# Patient Record
Sex: Male | Born: 1970 | Race: White | Hispanic: No | Marital: Married
Health system: Southern US, Community
[De-identification: ages and names within clinical notes are randomized; demographics above are authoritative.]

---

## 2018-10-03 ENCOUNTER — Inpatient Hospital Stay (HOSPITAL_COMMUNITY)
Admission: EM | Admit: 2018-10-03 | Discharge: 2018-10-10 | DRG: 082 | Disposition: E | Payer: Medicaid Other | Attending: General Surgery | Admitting: General Surgery

## 2018-10-03 ENCOUNTER — Other Ambulatory Visit: Payer: Self-pay

## 2018-10-03 ENCOUNTER — Encounter (HOSPITAL_COMMUNITY): Payer: Self-pay | Admitting: *Deleted

## 2018-10-03 ENCOUNTER — Inpatient Hospital Stay (HOSPITAL_COMMUNITY): Payer: Medicaid Other

## 2018-10-03 ENCOUNTER — Emergency Department (HOSPITAL_COMMUNITY): Payer: Medicaid Other

## 2018-10-03 DIAGNOSIS — S02831A Fracture of medial orbital wall, right side, initial encounter for closed fracture: Secondary | ICD-10-CM | POA: Diagnosis present

## 2018-10-03 DIAGNOSIS — S020XXA Fracture of vault of skull, initial encounter for closed fracture: Secondary | ICD-10-CM | POA: Diagnosis present

## 2018-10-03 DIAGNOSIS — S061X9A Traumatic cerebral edema with loss of consciousness of unspecified duration, initial encounter: Secondary | ICD-10-CM | POA: Diagnosis present

## 2018-10-03 DIAGNOSIS — S02101A Fracture of base of skull, right side, initial encounter for closed fracture: Secondary | ICD-10-CM | POA: Diagnosis present

## 2018-10-03 DIAGNOSIS — R402312 Coma scale, best motor response, none, at arrival to emergency department: Secondary | ICD-10-CM | POA: Diagnosis present

## 2018-10-03 DIAGNOSIS — S0231XA Fracture of orbital floor, right side, initial encounter for closed fracture: Secondary | ICD-10-CM | POA: Diagnosis present

## 2018-10-03 DIAGNOSIS — S0101XA Laceration without foreign body of scalp, initial encounter: Secondary | ICD-10-CM | POA: Diagnosis present

## 2018-10-03 DIAGNOSIS — S066X9A Traumatic subarachnoid hemorrhage with loss of consciousness of unspecified duration, initial encounter: Principal | ICD-10-CM | POA: Diagnosis present

## 2018-10-03 DIAGNOSIS — W3189XA Contact with other specified machinery, initial encounter: Secondary | ICD-10-CM

## 2018-10-03 DIAGNOSIS — T1490XA Injury, unspecified, initial encounter: Secondary | ICD-10-CM

## 2018-10-03 DIAGNOSIS — I959 Hypotension, unspecified: Secondary | ICD-10-CM | POA: Diagnosis not present

## 2018-10-03 DIAGNOSIS — E876 Hypokalemia: Secondary | ICD-10-CM | POA: Diagnosis present

## 2018-10-03 DIAGNOSIS — S0990XA Unspecified injury of head, initial encounter: Secondary | ICD-10-CM | POA: Diagnosis present

## 2018-10-03 DIAGNOSIS — Z20828 Contact with and (suspected) exposure to other viral communicable diseases: Secondary | ICD-10-CM | POA: Diagnosis present

## 2018-10-03 DIAGNOSIS — J9602 Acute respiratory failure with hypercapnia: Secondary | ICD-10-CM | POA: Diagnosis present

## 2018-10-03 DIAGNOSIS — Y9389 Activity, other specified: Secondary | ICD-10-CM

## 2018-10-03 DIAGNOSIS — S02841A Fracture of lateral orbital wall, right side, initial encounter for closed fracture: Secondary | ICD-10-CM | POA: Diagnosis present

## 2018-10-03 DIAGNOSIS — R402212 Coma scale, best verbal response, none, at arrival to emergency department: Secondary | ICD-10-CM | POA: Diagnosis present

## 2018-10-03 DIAGNOSIS — Z01818 Encounter for other preprocedural examination: Secondary | ICD-10-CM

## 2018-10-03 DIAGNOSIS — R4182 Altered mental status, unspecified: Secondary | ICD-10-CM | POA: Diagnosis present

## 2018-10-03 DIAGNOSIS — R0902 Hypoxemia: Secondary | ICD-10-CM

## 2018-10-03 DIAGNOSIS — R402112 Coma scale, eyes open, never, at arrival to emergency department: Secondary | ICD-10-CM | POA: Diagnosis present

## 2018-10-03 DIAGNOSIS — G9382 Brain death: Secondary | ICD-10-CM | POA: Diagnosis present

## 2018-10-03 DIAGNOSIS — Y9259 Other trade areas as the place of occurrence of the external cause: Secondary | ICD-10-CM

## 2018-10-03 DIAGNOSIS — I609 Nontraumatic subarachnoid hemorrhage, unspecified: Secondary | ICD-10-CM

## 2018-10-03 LAB — POCT I-STAT 7, (LYTES, BLD GAS, ICA,H+H)
Acid-base deficit: 2 mmol/L (ref 0.0–2.0)
Acid-base deficit: 2 mmol/L (ref 0.0–2.0)
Bicarbonate: 26 mmol/L (ref 20.0–28.0)
Bicarbonate: 21.1 mmol/L (ref 20.0–28.0)
Calcium, Ion: 1.1 mmol/L — ABNORMAL LOW (ref 1.15–1.40)
Calcium, Ion: 1.12 mmol/L — ABNORMAL LOW (ref 1.15–1.40)
HCT: 39 % (ref 39.0–52.0)
HCT: 38 % — ABNORMAL LOW (ref 39.0–52.0)
Hemoglobin: 13.3 g/dL (ref 13.0–17.0)
Hemoglobin: 12.9 g/dL — ABNORMAL LOW (ref 13.0–17.0)
O2 Saturation: 100 %
O2 Saturation: 99 %
Patient temperature: 95.5
Patient temperature: 38.7
Potassium: 3.8 mmol/L (ref 3.5–5.1)
Potassium: 4.2 mmol/L (ref 3.5–5.1)
Sodium: 138 mmol/L (ref 135–145)
Sodium: 140 mmol/L (ref 135–145)
TCO2: 28 mmol/L (ref 22–32)
TCO2: 22 mmol/L (ref 22–32)
pCO2 arterial: 53.9 mmHg — ABNORMAL HIGH (ref 32.0–48.0)
pCO2 arterial: 32.8 mmHg (ref 32.0–48.0)
pH, Arterial: 7.283 — ABNORMAL LOW (ref 7.350–7.450)
pH, Arterial: 7.423 (ref 7.350–7.450)
pO2, Arterial: 542 mmHg — ABNORMAL HIGH (ref 83.0–108.0)
pO2, Arterial: 141 mmHg — ABNORMAL HIGH (ref 83.0–108.0)

## 2018-10-03 LAB — URINALYSIS, ROUTINE W REFLEX MICROSCOPIC
Bilirubin Urine: NEGATIVE
Glucose, UA: >=500 mg/dL — AB
Ketones, ur: NEGATIVE mg/dL
Leukocytes,Ua: NEGATIVE
Nitrite: NEGATIVE
Protein, ur: 100 mg/dL — AB
Specific Gravity, Urine: 1.017 (ref 1.005–1.030)
pH: 5 (ref 5.0–8.0)

## 2018-10-03 LAB — COMPREHENSIVE METABOLIC PANEL
ALT: 37 U/L (ref 0–44)
AST: 46 U/L — ABNORMAL HIGH (ref 15–41)
Albumin: 3.4 g/dL — ABNORMAL LOW (ref 3.5–5.0)
Alkaline Phosphatase: 80 U/L (ref 38–126)
Anion gap: 15 (ref 5–15)
BUN: 15 mg/dL (ref 6–20)
CO2: 23 mmol/L (ref 22–32)
Calcium: 8 mg/dL — ABNORMAL LOW (ref 8.9–10.3)
Chloride: 98 mmol/L (ref 98–111)
Creatinine, Ser: 2.17 mg/dL — ABNORMAL HIGH (ref 0.61–1.24)
GFR calc Af Amer: 21 mL/min — ABNORMAL LOW (ref >60)
GFR calc non Af Amer: 18 mL/min — ABNORMAL LOW (ref >60)
Glucose, Bld: 329 mg/dL — ABNORMAL HIGH (ref 70–99)
Potassium: 3.9 mmol/L (ref 3.5–5.1)
Sodium: 136 mmol/L (ref 135–145)
Total Bilirubin: 0.2 mg/dL — ABNORMAL LOW (ref 0.3–1.2)
Total Protein: 5.6 g/dL — ABNORMAL LOW (ref 6.5–8.1)

## 2018-10-03 LAB — BASIC METABOLIC PANEL
Anion gap: 12 (ref 5–15)
BUN: 16 mg/dL (ref 6–20)
CO2: 22 mmol/L (ref 22–32)
Calcium: 7.9 mg/dL — ABNORMAL LOW (ref 8.9–10.3)
Chloride: 104 mmol/L (ref 98–111)
Creatinine, Ser: 1.81 mg/dL — ABNORMAL HIGH (ref 0.61–1.24)
GFR calc Af Amer: 50 mL/min — ABNORMAL LOW (ref >60)
GFR calc non Af Amer: 44 mL/min — ABNORMAL LOW (ref >60)
Glucose, Bld: 207 mg/dL — ABNORMAL HIGH (ref 70–99)
Potassium: 3.3 mmol/L — ABNORMAL LOW (ref 3.5–5.1)
Sodium: 138 mmol/L (ref 135–145)

## 2018-10-03 LAB — CBC
HCT: 39.3 % (ref 39.0–52.0)
HCT: 39.4 % (ref 39.0–52.0)
Hemoglobin: 12.7 g/dL — ABNORMAL LOW (ref 13.0–17.0)
Hemoglobin: 12.5 g/dL — ABNORMAL LOW (ref 13.0–17.0)
MCH: 33.7 pg (ref 26.0–34.0)
MCH: 33.5 pg (ref 26.0–34.0)
MCHC: 32.3 g/dL (ref 30.0–36.0)
MCHC: 31.7 g/dL (ref 30.0–36.0)
MCV: 104.2 fL — ABNORMAL HIGH (ref 80.0–100.0)
MCV: 105.6 fL — ABNORMAL HIGH (ref 80.0–100.0)
Platelets: 304 10*3/uL (ref 150–400)
Platelets: 314 10*3/uL (ref 150–400)
RBC: 3.77 MIL/uL — ABNORMAL LOW (ref 4.22–5.81)
RBC: 3.73 MIL/uL — ABNORMAL LOW (ref 4.22–5.81)
RDW: 12.7 % (ref 11.5–15.5)
RDW: 12.8 % (ref 11.5–15.5)
WBC: 14.7 10*3/uL — ABNORMAL HIGH (ref 4.0–10.5)
WBC: 14.4 10*3/uL — ABNORMAL HIGH (ref 4.0–10.5)
nRBC: 0 % (ref 0.0–0.2)
nRBC: 0.2 % (ref 0.0–0.2)

## 2018-10-03 LAB — CDS SEROLOGY

## 2018-10-03 LAB — LACTIC ACID, PLASMA: Lactic Acid, Venous: 6.4 mmol/L (ref 0.5–1.9)

## 2018-10-03 LAB — GLUCOSE, CAPILLARY
Glucose-Capillary: 133 mg/dL — ABNORMAL HIGH (ref 70–99)
Glucose-Capillary: 139 mg/dL — ABNORMAL HIGH (ref 70–99)
Glucose-Capillary: 106 mg/dL — ABNORMAL HIGH (ref 70–99)
Glucose-Capillary: 98 mg/dL (ref 70–99)
Glucose-Capillary: 103 mg/dL — ABNORMAL HIGH (ref 70–99)

## 2018-10-03 LAB — SARS CORONAVIRUS 2 (TAT 6-24 HRS): SARS Coronavirus 2: NEGATIVE

## 2018-10-03 LAB — SAMPLE TO BLOOD BANK

## 2018-10-03 LAB — PROTIME-INR
INR: 1.1 (ref 0.8–1.2)
Prothrombin Time: 14 seconds (ref 11.4–15.2)

## 2018-10-03 LAB — TRIGLYCERIDES: Triglycerides: 345 mg/dL — ABNORMAL HIGH (ref <150)

## 2018-10-03 LAB — ETHANOL: Alcohol, Ethyl (B): <10 mg/dL (ref <10)

## 2018-10-03 LAB — HEMOGLOBIN A1C
Hgb A1c MFr Bld: 5.2 % (ref 4.8–5.6)
Mean Plasma Glucose: 102.54 mg/dL

## 2018-10-03 LAB — CBG MONITORING, ED: Glucose-Capillary: 196 mg/dL — ABNORMAL HIGH (ref 70–99)

## 2018-10-03 MED ORDER — ACETAMINOPHEN 325 MG PO TABS
650.0000 mg | ORAL_TABLET | ORAL | Status: DC | PRN
  Administered 2018-10-03: 10:00:00 650 mg via ORAL
  Filled 2018-10-03: qty 2

## 2018-10-03 MED ORDER — MORPHINE SULFATE (PF) 2 MG/ML IV SOLN
2.0000 mg | INTRAVENOUS | Status: DC | PRN
  Administered 2018-10-07: 06:00:00 4 mg via INTRAVENOUS
  Filled 2018-10-03: qty 2

## 2018-10-03 MED ORDER — FENTANYL 2500MCG IN NS 250ML (10MCG/ML) PREMIX INFUSION
50.0000 ug/h | INTRAVENOUS | Status: DC
  Administered 2018-10-03: 02:00:00 50 ug/h via INTRAVENOUS
  Administered 2018-10-03 – 2018-10-06 (×6): 200 ug/h via INTRAVENOUS
  Filled 2018-10-03 (×6): qty 250

## 2018-10-03 MED ORDER — PROPOFOL 1000 MG/100ML IV EMUL
INTRAVENOUS | Status: AC
  Administered 2018-10-03: 06:00:00 30 ug/kg/min via INTRAVENOUS
  Filled 2018-10-03: qty 100

## 2018-10-03 MED ORDER — CHLORHEXIDINE GLUCONATE 0.12% ORAL RINSE (MEDLINE KIT)
15.0000 mL | Freq: Two times a day (BID) | OROMUCOSAL | Status: DC
  Administered 2018-10-03 – 2018-10-06 (×8): 15 mL via OROMUCOSAL

## 2018-10-03 MED ORDER — DOCUSATE SODIUM 100 MG PO CAPS: 100.0000 mg | ORAL_CAPSULE | Freq: Two times a day (BID) | ORAL | Status: DC

## 2018-10-03 MED ORDER — PANTOPRAZOLE SODIUM 40 MG PO PACK
40.0000 mg | PACK | Freq: Every day | ORAL | Status: DC
  Administered 2018-10-03: 40 mg
  Filled 2018-10-03: qty 20

## 2018-10-03 MED ORDER — INSULIN ASPART 100 UNIT/ML ~~LOC~~ SOLN
0.0000 [IU] | SUBCUTANEOUS | Status: DC
  Administered 2018-10-03: 2 [IU] via SUBCUTANEOUS
  Administered 2018-10-03: 04:00:00 3 [IU] via SUBCUTANEOUS
  Administered 2018-10-03: 10:00:00 2 [IU] via SUBCUTANEOUS

## 2018-10-03 MED ORDER — HYDRALAZINE HCL 20 MG/ML IJ SOLN: 10.0000 mg | INTRAMUSCULAR | Status: DC | PRN

## 2018-10-03 MED ORDER — FENTANYL BOLUS VIA INFUSION
50.0000 ug | INTRAVENOUS | Status: DC | PRN
  Administered 2018-10-03 (×3): 50 ug via INTRAVENOUS
  Filled 2018-10-03: qty 50

## 2018-10-03 MED ORDER — FENTANYL CITRATE (PF) 100 MCG/2ML IJ SOLN
50.0000 ug | Freq: Once | INTRAMUSCULAR | Status: AC
  Administered 2018-10-03: 02:00:00 50 ug via INTRAVENOUS

## 2018-10-03 MED ORDER — POTASSIUM CHLORIDE 10 MEQ/100ML IV SOLN
10.0000 meq | INTRAVENOUS | Status: AC
  Administered 2018-10-03 (×2): 10 meq via INTRAVENOUS
  Filled 2018-10-03 (×2): qty 100

## 2018-10-03 MED ORDER — SODIUM CHLORIDE 0.9 % IV SOLN
INTRAVENOUS | Status: DC
  Administered 2018-10-03 – 2018-10-06 (×8): via INTRAVENOUS

## 2018-10-03 MED ORDER — PROPOFOL 1000 MG/100ML IV EMUL
0.0000 ug/kg/min | INTRAVENOUS | Status: DC
  Administered 2018-10-03: 02:00:00 10 ug/kg/min via INTRAVENOUS
  Administered 2018-10-03: 20 ug/kg/min via INTRAVENOUS
  Administered 2018-10-03 (×2): 30 ug/kg/min via INTRAVENOUS
  Administered 2018-10-04 – 2018-10-06 (×5): 20 ug/kg/min via INTRAVENOUS
  Filled 2018-10-03 (×9): qty 100

## 2018-10-03 MED ORDER — FENTANYL 2500MCG IN NS 250ML (10MCG/ML) PREMIX INFUSION
0.0000 ug/h | INTRAVENOUS | Status: DC
  Filled 2018-10-03: qty 250

## 2018-10-03 MED ORDER — ETOMIDATE 2 MG/ML IV SOLN
INTRAVENOUS | Status: AC | PRN
  Administered 2018-10-03: 20 mg via INTRAVENOUS

## 2018-10-03 MED ORDER — PROPOFOL 1000 MG/100ML IV EMUL: 5.0000 ug/kg/min | INTRAVENOUS | Status: DC

## 2018-10-03 MED ORDER — SUCCINYLCHOLINE CHLORIDE 20 MG/ML IJ SOLN
INTRAMUSCULAR | Status: AC | PRN
  Administered 2018-10-03: 200 mg via INTRAVENOUS

## 2018-10-03 MED ORDER — ORAL CARE MOUTH RINSE
15.0000 mL | OROMUCOSAL | Status: DC
  Administered 2018-10-03 – 2018-10-07 (×38): 15 mL via OROMUCOSAL

## 2018-10-03 MED ORDER — CHLORHEXIDINE GLUCONATE CLOTH 2 % EX PADS
6.0000 | MEDICATED_PAD | Freq: Every day | CUTANEOUS | Status: DC
  Administered 2018-10-04: 04:00:00 6 via TOPICAL

## 2018-10-03 MED ORDER — METOPROLOL TARTRATE 5 MG/5ML IV SOLN
5.0000 mg | Freq: Four times a day (QID) | INTRAVENOUS | Status: DC | PRN
  Administered 2018-10-05: 15:00:00 5 mg via INTRAVENOUS
  Filled 2018-10-03 (×2): qty 5

## 2018-10-03 NOTE — Progress Notes (Addendum)
Patient ID: Deakon Frix, male   DOB: Feb 03, 1970, 48 y.o.   MRN: [id number]  We were called in regards to this 48 year old male who was working underneath a car when the driveshaft broke and the car landed on his head. He was found unresponsive, cpr was performed. Ct head shows diffuse cerebral edema with tonsillar herniation consistent with a severe traumatic brain injury and DAI. Multiple skull fractures with brain matter to be found in the right frontal sinus. Trauma is admitting to the icu. gcs is currently a 3 with myoclonic jerking. There is no acute surgical intervention needed at this time as this is not likely a survivable injury. CT and plan was discussed with Dr. Darolyn Rua will plan to formally consult this morning.   Agree with above....severe global cerebral edema, loss of gray/white differentiation, poor prognosis.

## 2018-10-03 NOTE — Consult Note (Signed)
Reason for Consult: Closed head injury Referring Physician: Trauma  Ryan Kyle Sr. is an 48 y.o. male.  HPI: 48 year old gentleman who was underneath a vehicle and drivetrain fell on his head unknown period of time he was under the vehicle patient presented unresponsive with diffuse subarachnoid hemorrhage and cerebral edema and we were consulted.  History reviewed. No pertinent past medical history.    No family history on file.  Social History:  has no history on file for tobacco, alcohol, and drug.  Allergies: Not on File  Medications: I have reviewed the patient's current medications.  Results for orders placed or performed during the hospital encounter of 2018-10-31 (from the past 48 hour(s))  CDS serology     Status: None   Collection Time: 10/31/18 12:38 AM  Result Value Ref Range   CDS serology specimen      SPECIMEN WILL BE HELD FOR 14 DAYS IF TESTING IS REQUIRED    Comment: SPECIMEN WILL BE HELD FOR 14 DAYS IF TESTING IS REQUIRED SPECIMEN WILL BE HELD FOR 14 DAYS IF TESTING IS REQUIRED Performed at [facility], [address], [place], [place] [address]   Comprehensive metabolic panel     Status: Abnormal   Collection Time: 2018/10/31 12:38 AM  Result Value Ref Range   Sodium 136 135 - 145 mmol/L   Potassium 3.9 3.5 - 5.1 mmol/L   Chloride 98 98 - 111 mmol/L   CO2 23 22 - 32 mmol/L   Glucose, Bld 329 (H) 70 - 99 mg/dL   BUN 15 6 - 20 mg/dL    Comment: QA FLAGS AND/OR RANGES MODIFIED BY DEMOGRAPHIC UPDATE ON 09/24 AT 0218   Creatinine, Ser 2.17 (H) 0.61 - 1.24 mg/dL   Calcium 8.0 (L) 8.9 - 10.3 mg/dL   Total Protein 5.6 (L) 6.5 - 8.1 g/dL   Albumin 3.4 (L) 3.5 - 5.0 g/dL   AST 46 (H) 15 - 41 U/L   ALT 37 0 - 44 U/L   Alkaline Phosphatase 80 38 - 126 U/L   Total Bilirubin 0.2 (L) 0.3 - 1.2 mg/dL   GFR calc non Af Amer 18 (L) >60 mL/min   GFR calc Af Amer 21 (L) >60 mL/min   Anion gap 15 5 - 15    Comment: Performed at [facility], [address], [place], [place] [address]  CBC     Status: Abnormal   Collection Time: Oct 31, 2018 12:38 AM  Result Value Ref Range   WBC 14.7 (H) 4.0 - 10.5 K/uL   RBC 3.77 (L) 4.22 - 5.81 MIL/uL   Hemoglobin 12.7 (L) 13.0 - 17.0 g/dL   HCT 09.8 11.9 - 14.7 %   MCV 104.2 (H) 80.0 - 100.0 fL   MCH 33.7 26.0 - 34.0 pg   MCHC 32.3 30.0 - 36.0 g/dL   RDW 82.9 56.2 - 13.0 %   Platelets 304 150 - 400 K/uL   nRBC 0.0 0.0 - 0.2 %    Comment: Performed at [facility], [address] [place], [place] [address]  Ethanol     Status: None   Collection Time: Oct 31, 2018 12:38 AM  Result Value Ref Range   Alcohol, Ethyl (B) <10 <10 mg/dL    Comment: (NOTE) Lowest detectable limit for serum alcohol is 10 mg/dL. For medical purposes only. Performed at [facility], [address], [place], [place] [address]   Lactic acid, plasma     Status: Abnormal  Collection Time: 10/08/2018 12:38 AM  Result Value Ref Range   Lactic Acid, Venous 6.4 (HH) 0.5 - 1.9 mmol/L    Comment: CRITICAL RESULT CALLED TO, READ BACK BY AND VERIFIED WITH: OLDLAND B,RN 09/20/2018 0142 WAYK Performed at [facility], [address] [place], [place] [address]   Protime-INR     Status: None   Collection Time: 09/13/2018 12:38 AM  Result Value Ref Range   Prothrombin Time 14.0 11.4 - 15.2 seconds   INR 1.1 0.8 - 1.2    Comment: (NOTE) INR goal varies based on device and disease states. Performed at [facility], [address] [place], [place] [address]   Sample to Blood Bank     Status: None   Collection Time: 09/16/2018 12:38 AM  Result Value Ref Range   Blood Bank Specimen SAMPLE AVAILABLE FOR TESTING    Sample Expiration      10/04/2018,2359 Performed at [facility], [address] [place], [place] [address]   SARS CORONAVIRUS 2 (TAT 6-24 HRS) Nasopharyngeal Nasopharyngeal Swab     Status: None   Collection Time: 09/18/2018  1:15 AM   Specimen: Nasopharyngeal Swab  Result Value Ref Range   SARS  Coronavirus 2 NEGATIVE NEGATIVE    Comment: (NOTE) SARS-CoV-2 target nucleic acids are NOT DETECTED. The SARS-CoV-2 RNA is generally detectable in upper and lower respiratory specimens during the acute phase of infection. Negative results do not preclude SARS-CoV-2 infection, do not rule out co-infections with other pathogens, and should not be used as the sole basis for treatment or other patient management decisions. Negative results must be combined with clinical observations, patient history, and epidemiological information. The expected result is Negative. Fact Sheet for Patients: HairSlick.nohttps://www.fda.gov/media/138098/download Fact Sheet for Healthcare Providers: quierodirigir.comhttps://www.fda.gov/media/138095/download This test is not yet approved or cleared by the [place] FDA and  has been authorized for detection and/or diagnosis of SARS-CoV-2 by FDA under an Emergency Use Authorization (EUA). This EUA will remain  in effect (meaning this test can be used) for the duration of the COVID-19 declaration under Section 56 4(b)(1) of the Act, 21 U.S.C. section 360bbb-3(b)(1), unless the authorization is terminated or revoked sooner. Performed at [facility], [address] [place], [place] [address]   I-STAT 7, (LYTES, BLD GAS, ICA, H+H)     Status: Abnormal   Collection Time: 09/13/2018  2:15 AM  Result Value Ref Range   pH, Arterial 7.283 (L) 7.350 - 7.450   pCO2 arterial 53.9 (H) 32.0 - 48.0 mmHg   pO2, Arterial 542.0 (H) 83.0 - 108.0 mmHg   Bicarbonate 26.0 20.0 - 28.0 mmol/L   TCO2 28 22 - 32 mmol/L   O2 Saturation 100.0 %   Acid-base deficit 2.0 0.0 - 2.0 mmol/L   Sodium 138 135 - 145 mmol/L   Potassium 3.8 3.5 - 5.1 mmol/L   Calcium, Ion 1.10 (L) 1.15 - 1.40 mmol/L   HCT 39.0 39.0 - 52.0 %   Hemoglobin 13.3 13.0 - 17.0 g/dL   Patient temperature 29.595.5 F    Collection site RADIAL, ALLEN'S TEST ACCEPTABLE    Drawn by RT    Sample type ARTERIAL   Urinalysis, Routine w reflex  microscopic     Status: Abnormal   Collection Time: 09/29/2018  2:44 AM  Result Value Ref Range   Color, Urine YELLOW YELLOW   APPearance HAZY (A) CLEAR   Specific Gravity, Urine 1.017 1.005 - 1.030   pH 5.0  5.0 - 8.0   Glucose, UA >=500 (A) NEGATIVE mg/dL   Hgb urine dipstick SMALL (A) NEGATIVE   Bilirubin Urine NEGATIVE NEGATIVE   Ketones, ur NEGATIVE NEGATIVE mg/dL   Protein, ur 322 (A) NEGATIVE mg/dL   Nitrite NEGATIVE NEGATIVE   Leukocytes,Ua NEGATIVE NEGATIVE   RBC / HPF 6-10 0 - 5 RBC/hpf   WBC, UA 0-5 0 - 5 WBC/hpf   Bacteria, UA RARE (A) NONE SEEN   Squamous Epithelial / LPF 0-5 0 - 5   Hyaline Casts, UA PRESENT     Comment: Performed at [facility], [address] [place], [place] [address]  CBC     Status: Abnormal   Collection Time: 09/29/2018  3:38 AM  Result Value Ref Range   WBC 14.4 (H) 4.0 - 10.5 K/uL   RBC 3.73 (L) 4.22 - 5.81 MIL/uL   Hemoglobin 12.5 (L) 13.0 - 17.0 g/dL   HCT 70.6 23.7 - 62.8 %   MCV 105.6 (H) 80.0 - 100.0 fL   MCH 33.5 26.0 - 34.0 pg   MCHC 31.7 30.0 - 36.0 g/dL   RDW 31.5 17.6 - 16.0 %   Platelets 314 150 - 400 K/uL   nRBC 0.2 0.0 - 0.2 %    Comment: Performed at [facility], [address] [place], [place] [address]  Basic metabolic panel     Status: Abnormal   Collection Time: 09/29/2018  3:38 AM  Result Value Ref Range   Sodium 138 135 - 145 mmol/L   Potassium 3.3 (L) 3.5 - 5.1 mmol/L   Chloride 104 98 - 111 mmol/L   CO2 22 22 - 32 mmol/L   Glucose, Bld 207 (H) 70 - 99 mg/dL   BUN 16 6 - 20 mg/dL   Creatinine, Ser 6.26 (H) 0.61 - 1.24 mg/dL   Calcium 7.9 (L) 8.9 - 10.3 mg/dL   GFR calc non Af Amer 44 (L) >60 mL/min   GFR calc Af Amer 50 (L) >60 mL/min   Anion gap 12 5 - 15    Comment: Performed at [facility], [address] [place], [place] [address]  Hemoglobin A1c     Status: None   Collection Time: 09/30/2018  3:38 AM  Result Value Ref Range   Hgb A1c MFr Bld 5.2 4.8 - 5.6 %    Comment: (NOTE) Pre  diabetes:          5.7%-6.4% Diabetes:              >6.4% Glycemic control for   <7.0% adults with diabetes    Mean Plasma Glucose 102.54 mg/dL    Comment: Performed at [facility], [address], [place], [place] [address]  Triglycerides     Status: Abnormal   Collection Time: 10/09/2018  3:38 AM  Result Value Ref Range   Triglycerides 345 (H) <150 mg/dL    Comment: Performed at [facility], [address], [place], [place] [address]  CBG monitoring, ED     Status: Abnormal   Collection Time: 09/14/2018  3:55 AM  Result Value Ref Range   Glucose-Capillary 196 (H) 70 - 99 mg/dL    Ct Head Wo Contrast  Result Date: 09/28/2018 CLINICAL DATA:  Level 1 trauma. Found under a car with head trauma. EXAM: CT HEAD WITHOUT CONTRAST CT MAXILLOFACIAL WITHOUT CONTRAST CT CERVICAL SPINE WITHOUT CONTRAST TECHNIQUE: Multidetector CT imaging of the head, cervical spine, and maxillofacial structures were  performed using the standard protocol without intravenous contrast. Multiplanar CT image reconstructions of the cervical spine and maxillofacial structures were also generated. COMPARISON:  None. FINDINGS: CT HEAD FINDINGS Brain: Diffuse effacement of sulci consistent with cerebral edema. Basilar cistern effacement consistent with tonsillar herniation. Small size ventricles. Right frontal skull fracture with brain herniation into the frontal sinus. Small volume pneumocephalus. Associated subarachnoid hemorrhage in the right frontal lobe. Subarachnoid hemorrhage in the left parietal lobe. Increased density of the circle-of-Willis may be subarachnoid hemorrhage versus pseudo subarachnoid hemorrhage related to cerebral edema. Vascular: Symmetric hyperdensity in the region the MCAs. Skull: Displaced right frontal bone fracture with brain herniating into the frontal sinus. Fracture extends to the vertex into the left frontal and parietal bone. Nondisplaced fracture extends inferolaterally to involve the  anterior right temporal bone. Skull base fractures extend through the sella which is displaced. Displaced bilateral posterior occipital fractures. Other: Large right frontal scalp hematoma and air in the soft tissues. CT MAXILLOFACIAL FINDINGS Osseous: Displaced fracture through the right pterygoid plate. No nasal bone, zygomatic, or mandibular fracture. Temporomandibular joints are congruent. Poor dentition with multiple missing teeth. Leftward nasal septal deviation. Orbits: Comminuted right orbital fracture involving the superior, inferior, medial and lateral walls. Retrobulbar air and edema. There is mild right proptosis. No evidence of globe injury. No left orbital fracture. Sinuses: Displaced fracture through the right frontal sinus with brain herniation into the sinus cavity. Comminuted fractures through the right ethmoid air cells, right and left sphenoid sinus and sella turcica and right maxillary sinus. Heterogeneous opacification of the sinuses likely blood. Right maxillary hemosinus. Scattered air in the soft tissues likely secondary to sinus fracture. Soft tissues: Right periorbital hematoma. Multifocal soft tissue air. CT CERVICAL SPINE FINDINGS Alignment: Normal. Skull base and vertebrae: Bilateral skull base fractures just lateral to the occipital condyles, displaced on the right. Dens is intact. No fracture of the cervical spine. Soft tissues and spinal canal: Skull base hemorrhage. Disc levels: Disc space narrowing endplate spurring V6-H6 and C6-C7. Upper chest: Dependent right upper lobe opacity. No pneumothorax Other: None. Critical Value/emergent results were called by telephone at the time of interpretation on 10/15/2018 at 1:35 am to providerKinsinger , who verbally acknowledged these results. IMPRESSION: 1. Traumatic brain injury with multifocal subarachnoid hemorrhage, diffuse cerebral edema, and tonsillar herniation. Small right frontal pneumocephalus. 2. Displaced right frontal bone  fracture with brain matter extending into the right frontal sinus. Fracture extends to the right temporal, left frontal and parietal bones. 3. Fractures through the right frontal sinus, ethmoid air cells, maxillary sinus and both sphenoid sinuses. Skull base fractures through the sella turcica and extending just lateral to the occipital condyles. 4. Complex right orbital fracture involving medial, lateral, inferior and superior walls. Right orbital proptosis. Right pterygoid plate fracture. 5. No fracture or dislocation of the cervical spine. Electronically Signed   By: Keith Rake M.D.   On: 2018/10/15 01:50   Ct Cervical Spine Wo Contrast  Result Date: 10-15-2018 CLINICAL DATA:  Level 1 trauma. Found under a car with head trauma. EXAM: CT HEAD WITHOUT CONTRAST CT MAXILLOFACIAL WITHOUT CONTRAST CT CERVICAL SPINE WITHOUT CONTRAST TECHNIQUE: Multidetector CT imaging of the head, cervical spine, and maxillofacial structures were performed using the standard protocol without intravenous contrast. Multiplanar CT image reconstructions of the cervical spine and maxillofacial structures were also generated. COMPARISON:  None. FINDINGS: CT HEAD FINDINGS Brain: Diffuse effacement of sulci consistent with cerebral edema. Basilar cistern effacement consistent with tonsillar herniation. Small size ventricles. Right  frontal skull fracture with brain herniation into the frontal sinus. Small volume pneumocephalus. Associated subarachnoid hemorrhage in the right frontal lobe. Subarachnoid hemorrhage in the left parietal lobe. Increased density of the circle-of-Willis may be subarachnoid hemorrhage versus pseudo subarachnoid hemorrhage related to cerebral edema. Vascular: Symmetric hyperdensity in the region the MCAs. Skull: Displaced right frontal bone fracture with brain herniating into the frontal sinus. Fracture extends to the vertex into the left frontal and parietal bone. Nondisplaced fracture extends inferolaterally  to involve the anterior right temporal bone. Skull base fractures extend through the sella which is displaced. Displaced bilateral posterior occipital fractures. Other: Large right frontal scalp hematoma and air in the soft tissues. CT MAXILLOFACIAL FINDINGS Osseous: Displaced fracture through the right pterygoid plate. No nasal bone, zygomatic, or mandibular fracture. Temporomandibular joints are congruent. Poor dentition with multiple missing teeth. Leftward nasal septal deviation. Orbits: Comminuted right orbital fracture involving the superior, inferior, medial and lateral walls. Retrobulbar air and edema. There is mild right proptosis. No evidence of globe injury. No left orbital fracture. Sinuses: Displaced fracture through the right frontal sinus with brain herniation into the sinus cavity. Comminuted fractures through the right ethmoid air cells, right and left sphenoid sinus and sella turcica and right maxillary sinus. Heterogeneous opacification of the sinuses likely blood. Right maxillary hemosinus. Scattered air in the soft tissues likely secondary to sinus fracture. Soft tissues: Right periorbital hematoma. Multifocal soft tissue air. CT CERVICAL SPINE FINDINGS Alignment: Normal. Skull base and vertebrae: Bilateral skull base fractures just lateral to the occipital condyles, displaced on the right. Dens is intact. No fracture of the cervical spine. Soft tissues and spinal canal: Skull base hemorrhage. Disc levels: Disc space narrowing endplate spurring Z6-X0 and C6-C7. Upper chest: Dependent right upper lobe opacity. No pneumothorax Other: None. Critical Value/emergent results were called by telephone at the time of interpretation on 10/04/2018 at 1:35 am to providerKinsinger , who verbally acknowledged these results. IMPRESSION: 1. Traumatic brain injury with multifocal subarachnoid hemorrhage, diffuse cerebral edema, and tonsillar herniation. Small right frontal pneumocephalus. 2. Displaced right  frontal bone fracture with brain matter extending into the right frontal sinus. Fracture extends to the right temporal, left frontal and parietal bones. 3. Fractures through the right frontal sinus, ethmoid air cells, maxillary sinus and both sphenoid sinuses. Skull base fractures through the sella turcica and extending just lateral to the occipital condyles. 4. Complex right orbital fracture involving medial, lateral, inferior and superior walls. Right orbital proptosis. Right pterygoid plate fracture. 5. No fracture or dislocation of the cervical spine. Electronically Signed   By: Narda Rutherford M.D.   On: 10/06/2018 01:50   Dg Pelvis Portable  Result Date: 10/06/2018 CLINICAL DATA:  Level 1 trauma. EXAM: PORTABLE PELVIS 1-2 VIEWS COMPARISON:  None. FINDINGS: The cortical margins of the bony pelvis are intact. No fracture. Pubic symphysis and sacroiliac joints are congruent. Both femoral heads are well-seated in the respective acetabula. IMPRESSION: No pelvic fracture. Electronically Signed   By: Narda Rutherford M.D.   On: 09/18/2018 01:15   Dg Chest Port 1 View  Result Date: 09/17/2018 CLINICAL DATA:  Level 1 trauma. EXAM: PORTABLE CHEST 1 VIEW COMPARISON:  None. FINDINGS: Endotracheal tube tip at the thoracic inlet 5.6 cm from the carina. Enteric tube in place with tip below the diaphragm not included in the field of view. Lung volumes are low. Ill-defined right suprahilar opacity. Heart is normal in size. Normal mediastinal contours for technique. No pneumothorax or pleural effusion. Linear rounded densities  projecting over the right upper abdomen are likely external to the patient. No acute osseous abnormalities are seen. IMPRESSION: 1. Endotracheal tube tip at the thoracic inlet 5.6 cm from the carina. Enteric tube in place with tip below the diaphragm not included in the field of view. 2. Low lung volumes with vague right suprahilar opacity. This may be contusion, aspiration, or asymmetric  pulmonary edema. Electronically Signed   By: Narda Rutherford M.D.   On: 09/13/2018 01:17   Ct Maxillofacial Wo Contrast  Result Date: 09/17/2018 CLINICAL DATA:  Level 1 trauma. Found under a car with head trauma. EXAM: CT HEAD WITHOUT CONTRAST CT MAXILLOFACIAL WITHOUT CONTRAST CT CERVICAL SPINE WITHOUT CONTRAST TECHNIQUE: Multidetector CT imaging of the head, cervical spine, and maxillofacial structures were performed using the standard protocol without intravenous contrast. Multiplanar CT image reconstructions of the cervical spine and maxillofacial structures were also generated. COMPARISON:  None. FINDINGS: CT HEAD FINDINGS Brain: Diffuse effacement of sulci consistent with cerebral edema. Basilar cistern effacement consistent with tonsillar herniation. Small size ventricles. Right frontal skull fracture with brain herniation into the frontal sinus. Small volume pneumocephalus. Associated subarachnoid hemorrhage in the right frontal lobe. Subarachnoid hemorrhage in the left parietal lobe. Increased density of the circle-of-Willis may be subarachnoid hemorrhage versus pseudo subarachnoid hemorrhage related to cerebral edema. Vascular: Symmetric hyperdensity in the region the MCAs. Skull: Displaced right frontal bone fracture with brain herniating into the frontal sinus. Fracture extends to the vertex into the left frontal and parietal bone. Nondisplaced fracture extends inferolaterally to involve the anterior right temporal bone. Skull base fractures extend through the sella which is displaced. Displaced bilateral posterior occipital fractures. Other: Large right frontal scalp hematoma and air in the soft tissues. CT MAXILLOFACIAL FINDINGS Osseous: Displaced fracture through the right pterygoid plate. No nasal bone, zygomatic, or mandibular fracture. Temporomandibular joints are congruent. Poor dentition with multiple missing teeth. Leftward nasal septal deviation. Orbits: Comminuted right orbital fracture  involving the superior, inferior, medial and lateral walls. Retrobulbar air and edema. There is mild right proptosis. No evidence of globe injury. No left orbital fracture. Sinuses: Displaced fracture through the right frontal sinus with brain herniation into the sinus cavity. Comminuted fractures through the right ethmoid air cells, right and left sphenoid sinus and sella turcica and right maxillary sinus. Heterogeneous opacification of the sinuses likely blood. Right maxillary hemosinus. Scattered air in the soft tissues likely secondary to sinus fracture. Soft tissues: Right periorbital hematoma. Multifocal soft tissue air. CT CERVICAL SPINE FINDINGS Alignment: Normal. Skull base and vertebrae: Bilateral skull base fractures just lateral to the occipital condyles, displaced on the right. Dens is intact. No fracture of the cervical spine. Soft tissues and spinal canal: Skull base hemorrhage. Disc levels: Disc space narrowing endplate spurring Z6-X0 and C6-C7. Upper chest: Dependent right upper lobe opacity. No pneumothorax Other: None. Critical Value/emergent results were called by telephone at the time of interpretation on 09/10/2018 at 1:35 am to providerKinsinger , who verbally acknowledged these results. IMPRESSION: 1. Traumatic brain injury with multifocal subarachnoid hemorrhage, diffuse cerebral edema, and tonsillar herniation. Small right frontal pneumocephalus. 2. Displaced right frontal bone fracture with brain matter extending into the right frontal sinus. Fracture extends to the right temporal, left frontal and parietal bones. 3. Fractures through the right frontal sinus, ethmoid air cells, maxillary sinus and both sphenoid sinuses. Skull base fractures through the sella turcica and extending just lateral to the occipital condyles. 4. Complex right orbital fracture involving medial, lateral, inferior and superior walls.  Right orbital proptosis. Right pterygoid plate fracture. 5. No fracture or  dislocation of the cervical spine. Electronically Signed   By: Narda Rutherford M.D.   On: 10-23-2018 01:50    Review of Systems  Unable to perform ROS: Critical illness   Blood pressure (!) 160/103, pulse (!) 108, temperature 99.9 F (37.7 C), resp. rate (!) 32, height 6' (1.829 m), weight 113.4 kg, SpO2 100 %. Physical Exam  Neurological:  Pupils 3 not reactive significant right periorbital edema left no corneal no gag no response to noxious stimulation diffuse myoclonic jerks    Assessment/Plan: 48 year old with severe close head injury CT scan shows diffuse subarachnoid hemorrhage diffuse cerebral edema tonsillar herniation subarachnoid around the cervical medullary junction diffuse loss of gray-white especially in his temporal lobes and around his brainstem.  This imports a very poor prognosis and I do not feel like this is consistent with functional survival.  Patient has minimal to no exam.  Consider repeat CT later and possible blood flow study if once he is off propofol still displays no evidence of movement.  Ryan Mueller October 23, 2018, 8:30 AM

## 2018-10-03 NOTE — Care Management (Signed)
Pt inappropriate for SBIRT assessment at this time, as he is currently sedated and intubated.    Reinaldo Raddle, RN, BSN  Trauma/Neuro ICU Case Manager [phone]

## 2018-10-03 NOTE — Progress Notes (Signed)
RT transported pt to and from CT without event. 

## 2018-10-03 NOTE — Progress Notes (Signed)
Pt belongings include 7 one-dollar bills, 7 quarters, 3 dimes, 8 pennies, white chapstick, black knife, silver lighter,and two sets of keys. Also has shoes and other clothing.
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
## 2018-10-03 NOTE — Progress Notes (Signed)
Chaplain visited to support family and pray as the family was dealing with end of life. The chaplain will follow this situation closely.  Brion Aliment Chaplain Resident For questions concerning this note please contact me by pager [phone]
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
## 2018-10-03 NOTE — Progress Notes (Signed)
Patient ID: Ryan Hermann Sr., male   DOB: December 20, 1970, 48 y.o.   MRN: [id number] F/U CT H results reviewed with Dr. Saintclair Halsted.  Georganna Skeans, MD, MPH, [place] Trauma & General Surgery: [phone]
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
## 2018-10-03 NOTE — ED Notes (Signed)
Patient son calling Ab Leaming [phone] or we can call patient wife's number [phone] her name is Argenis Kumari   Asking for an update on patient, please call

## 2018-10-03 NOTE — ED Notes (Signed)
Myoclonic jerking noted

## 2018-10-03 NOTE — Progress Notes (Signed)
Patient ID: Ryan Thor Sr., male   DOB: Feb 24, 1970, 47 y.o.   MRN: [id number] Follow up - Trauma Critical Care  Patient Details:    Ryan Mahaffy Sr. is an 48 y.o. male.  Lines/tubes : Airway 7.5 mm (Active)  Secured at (cm) 25 cm 10-18-2018 0812  Measured From Lips 10-18-2018 0812  Secured Location Left 10-18-18 0812  Secured By [employer] 10-18-2018 0812  Tube Holder Repositioned Yes October 18, 2018 0812  Cuff Pressure (cm H2O) 30 cm H2O 10-18-2018 7829  Site Condition Drainage (Comment);Other (Comment) Oct 18, 2018 5621     NG/OG Tube Orogastric 16 Fr. Center mouth Xray (Active)  Site Assessment Clean;Dry;Intact October 18, 2018 0800  Ongoing Placement Verification No change in cm markings or external length of tube from initial placement;No change in respiratory status;No acute changes, not attributed to clinical condition 2018/10/18 0800  Status Clamped 10-18-2018 0800     Urethral Catheter (Active)  Indication for Insertion or Continuance of Catheter Unstable critically ill patients first 24-48 hours (See Criteria) 10/18/18 0800  Site Assessment Clean;Intact 2018/10/18 0800  Catheter Maintenance Bag below level of bladder;Catheter secured;Drainage bag/tubing not touching floor;Insertion date on drainage bag;No dependent loops;Seal intact 18-Oct-2018 0800  Collection Container Standard drainage bag 18-Oct-2018 0800  Securement Method Securing device (Describe) 10/18/2018 0800  Urinary Catheter Interventions (if applicable) Unclamped 30/86/57 0800    Microbiology/Sepsis markers: Results for orders placed or performed during the hospital encounter of 2018/10/18  SARS CORONAVIRUS 2 (TAT 6-24 HRS) Nasopharyngeal Nasopharyngeal Swab     Status: None   Collection Time: Oct 18, 2018  1:15 AM   Specimen: Nasopharyngeal Swab  Result Value Ref Range Status   SARS Coronavirus 2 NEGATIVE NEGATIVE Final    Comment: (NOTE) SARS-CoV-2 target nucleic acids are NOT DETECTED. The SARS-CoV-2 RNA is generally detectable  in upper and lower respiratory specimens during the acute phase of infection. Negative results do not preclude SARS-CoV-2 infection, do not rule out co-infections with other pathogens, and should not be used as the sole basis for treatment or other patient management decisions. Negative results must be combined with clinical observations, patient history, and epidemiological information. The expected result is Negative. Fact Sheet for Patients: SugarRoll.be Fact Sheet for Healthcare Providers: https://www.woods-mathews.com/ This test is not yet approved or cleared by the [place] FDA and  has been authorized for detection and/or diagnosis of SARS-CoV-2 by FDA under an Emergency Use Authorization (EUA). This EUA will remain  in effect (meaning this test can be used) for the duration of the COVID-19 declaration under Section 56 4(b)(1) of the Act, 21 U.S.C. section 360bbb-3(b)(1), unless the authorization is terminated or revoked sooner. Performed at [facility], [place] [address], [place], [place] [address]     Anti-infectives:  Anti-infectives (From admission, onward)   None      Best Practice/Protocols:  VTE Prophylaxis: Mechanical Continous Sedation  Consults: Treatment Team:  Kary Kos, MD    Studies:    Events:  Subjective:    Overnight Issues:   Objective:  Vital signs for last 24 hours: Temp:  [95 F (35 C)-100.2 F (37.9 C)] 100.2 F (37.9 C) (09/24 0800) Pulse Rate:  [91-108] 108 (09/24 0811) Resp:  [13-42] 32 (09/24 0811) BP: (122-192)/(58-115) 160/103 (09/24 0811) SpO2:  [97 %-100 %] 100 % (09/24 0812) FiO2 (%):  [40 %-100 %] 40 % (09/24 0812) Weight:  [113.4 kg] 113.4 kg (09/24 0139)  Hemodynamic parameters for last 24 hours:    Intake/Output from previous day: 09/23 0701 -  09/24 0700 In: 700 [I.V.:700] Out: 250 [Urine:250]  Intake/Output this shift: Total I/O In: 765.8 [I.V.:765.8]  Out: -   Vent settings for last 24 hours: Vent Mode: PRVC FiO2 (%):  [40 %-100 %] 40 % Set Rate:  [16 bmp] 16 bmp Vt Set:  [500 mL-620 mL] 620 mL PEEP:  [5 cmH20] 5 cmH20 Plateau Pressure:  [18 cmH20] 18 cmH20  Physical Exam:  General: on vent Neuro: myoclonic jerks, no movement to stim, breathes over vent HEENT/Neck: ETT Resp: clear to auscultation bilaterally CVS: RRR GI: soft, nontender, BS WNL, no r/g Extremities: no edema, no erythema, pulses WNL  Results for orders placed or performed during the hospital encounter of Oct 28, 2018 (from the past 24 hour(s))  CDS serology     Status: None   Collection Time: 2018-10-28 12:38 AM  Result Value Ref Range   CDS serology specimen      SPECIMEN WILL BE HELD FOR 14 DAYS IF TESTING IS REQUIRED  Comprehensive metabolic panel     Status: Abnormal   Collection Time: October 28, 2018 12:38 AM  Result Value Ref Range   Sodium 136 135 - 145 mmol/L   Potassium 3.9 3.5 - 5.1 mmol/L   Chloride 98 98 - 111 mmol/L   CO2 23 22 - 32 mmol/L   Glucose, Bld 329 (H) 70 - 99 mg/dL   BUN 15 6 - 20 mg/dL   Creatinine, Ser 4.09 (H) 0.61 - 1.24 mg/dL   Calcium 8.0 (L) 8.9 - 10.3 mg/dL   Total Protein 5.6 (L) 6.5 - 8.1 g/dL   Albumin 3.4 (L) 3.5 - 5.0 g/dL   AST 46 (H) 15 - 41 U/L   ALT 37 0 - 44 U/L   Alkaline Phosphatase 80 38 - 126 U/L   Total Bilirubin 0.2 (L) 0.3 - 1.2 mg/dL   GFR calc non Af Amer 18 (L) >60 mL/min   GFR calc Af Amer 21 (L) >60 mL/min   Anion gap 15 5 - 15  CBC     Status: Abnormal   Collection Time: October 28, 2018 12:38 AM  Result Value Ref Range   WBC 14.7 (H) 4.0 - 10.5 K/uL   RBC 3.77 (L) 4.22 - 5.81 MIL/uL   Hemoglobin 12.7 (L) 13.0 - 17.0 g/dL   HCT 81.1 91.4 - 78.2 %   MCV 104.2 (H) 80.0 - 100.0 fL   MCH 33.7 26.0 - 34.0 pg   MCHC 32.3 30.0 - 36.0 g/dL   RDW 95.6 21.3 - 08.6 %   Platelets 304 150 - 400 K/uL   nRBC 0.0 0.0 - 0.2 %  Ethanol     Status: None   Collection Time: 10-28-2018 12:38 AM  Result Value Ref Range    Alcohol, Ethyl (B) <10 <10 mg/dL  Lactic acid, plasma     Status: Abnormal   Collection Time: 2018-10-28 12:38 AM  Result Value Ref Range   Lactic Acid, Venous 6.4 (HH) 0.5 - 1.9 mmol/L  Protime-INR     Status: None   Collection Time: 2018-10-28 12:38 AM  Result Value Ref Range   Prothrombin Time 14.0 11.4 - 15.2 seconds   INR 1.1 0.8 - 1.2  Sample to Blood Bank     Status: None   Collection Time: 10/28/2018 12:38 AM  Result Value Ref Range   Blood Bank Specimen SAMPLE AVAILABLE FOR TESTING    Sample Expiration      10/04/2018,2359 Performed at [facility], [address], [place], [place] [address]  SARS CORONAVIRUS 2 (TAT 6-24 HRS) Nasopharyngeal Nasopharyngeal Swab     Status: None   Collection Time: 09/21/2018  1:15 AM   Specimen: Nasopharyngeal Swab  Result Value Ref Range   SARS Coronavirus 2 NEGATIVE NEGATIVE  I-STAT 7, (LYTES, BLD GAS, ICA, H+H)     Status: Abnormal   Collection Time: 09/23/2018  2:15 AM  Result Value Ref Range   pH, Arterial 7.283 (L) 7.350 - 7.450   pCO2 arterial 53.9 (H) 32.0 - 48.0 mmHg   pO2, Arterial 542.0 (H) 83.0 - 108.0 mmHg   Bicarbonate 26.0 20.0 - 28.0 mmol/L   TCO2 28 22 - 32 mmol/L   O2 Saturation 100.0 %   Acid-base deficit 2.0 0.0 - 2.0 mmol/L   Sodium 138 135 - 145 mmol/L   Potassium 3.8 3.5 - 5.1 mmol/L   Calcium, Ion 1.10 (L) 1.15 - 1.40 mmol/L   HCT 39.0 39.0 - 52.0 %   Hemoglobin 13.3 13.0 - 17.0 g/dL   Patient temperature 29.7 F    Collection site RADIAL, ALLEN'S TEST ACCEPTABLE    Drawn by RT    Sample type ARTERIAL   Urinalysis, Routine w reflex microscopic     Status: Abnormal   Collection Time: 09/20/2018  2:44 AM  Result Value Ref Range   Color, Urine YELLOW YELLOW   APPearance HAZY (A) CLEAR   Specific Gravity, Urine 1.017 1.005 - 1.030   pH 5.0 5.0 - 8.0   Glucose, UA >=500 (A) NEGATIVE mg/dL   Hgb urine dipstick SMALL (A) NEGATIVE   Bilirubin Urine NEGATIVE NEGATIVE   Ketones, ur NEGATIVE NEGATIVE mg/dL   Protein,  ur 989 (A) NEGATIVE mg/dL   Nitrite NEGATIVE NEGATIVE   Leukocytes,Ua NEGATIVE NEGATIVE   RBC / HPF 6-10 0 - 5 RBC/hpf   WBC, UA 0-5 0 - 5 WBC/hpf   Bacteria, UA RARE (A) NONE SEEN   Squamous Epithelial / LPF 0-5 0 - 5   Hyaline Casts, UA PRESENT   CBC     Status: Abnormal   Collection Time: 09/19/2018  3:38 AM  Result Value Ref Range   WBC 14.4 (H) 4.0 - 10.5 K/uL   RBC 3.73 (L) 4.22 - 5.81 MIL/uL   Hemoglobin 12.5 (L) 13.0 - 17.0 g/dL   HCT 21.1 94.1 - 74.0 %   MCV 105.6 (H) 80.0 - 100.0 fL   MCH 33.5 26.0 - 34.0 pg   MCHC 31.7 30.0 - 36.0 g/dL   RDW 81.4 48.1 - 85.6 %   Platelets 314 150 - 400 K/uL   nRBC 0.2 0.0 - 0.2 %  Basic metabolic panel     Status: Abnormal   Collection Time: 09/28/2018  3:38 AM  Result Value Ref Range   Sodium 138 135 - 145 mmol/L   Potassium 3.3 (L) 3.5 - 5.1 mmol/L   Chloride 104 98 - 111 mmol/L   CO2 22 22 - 32 mmol/L   Glucose, Bld 207 (H) 70 - 99 mg/dL   BUN 16 6 - 20 mg/dL   Creatinine, Ser 3.14 (H) 0.61 - 1.24 mg/dL   Calcium 7.9 (L) 8.9 - 10.3 mg/dL   GFR calc non Af Amer 44 (L) >60 mL/min   GFR calc Af Amer 50 (L) >60 mL/min   Anion gap 12 5 - 15  Hemoglobin A1c     Status: None   Collection Time: 09/17/2018  3:38 AM  Result Value Ref Range   Hgb A1c MFr Bld 5.2 4.8 -  5.6 %   Mean Plasma Glucose 102.54 mg/dL  Triglycerides     Status: Abnormal   Collection Time: 09/26/2018  3:38 AM  Result Value Ref Range   Triglycerides 345 (H) <150 mg/dL  CBG monitoring, ED     Status: Abnormal   Collection Time: 09/24/2018  3:55 AM  Result Value Ref Range   Glucose-Capillary 196 (H) 70 - 99 mg/dL    Assessment & Plan: Present on Admission: . Head injury    LOS: 0 days   Additional comments:I reviewed the patient's new clinical lab test results. .   Drive shaft fell on head Severe TBI/multifocal SAH/diffuse cerebral edema/tonsilar herniation/R frontal pneumocephalus - I reviewed films with Dr. Wynetta Emeryram. 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 Repeat CT this PM.  Likely unsurvivable Acute hypercarbic ventilator dependent respiratory failure - MV increased, ABG at 1200, target PaCO2 35-39 Frontal/maxillary/sphenoid/ethmoid sinus FXs - dark blood and brain matter into mouth R orbit FX R pterygoid plate FX and skull base FX through sella turcica FEN - hold TF for now, replete hypokalemia VTE - PAS DIspo - ICU  Critical Care Total Time*: 1 Hour 18 Minutes  Ryan GelinasBurke Aldea Avis, MD, MPH, FACS Trauma & General Surgery: [phone]  09/16/2018  *Care during the described time interval was provided by me. I have reviewed this patient's available data, including medical history, events of note, physical examination and test results as part of my evaluation.
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
## 2018-10-03 NOTE — Procedures (Signed)
Preoperative diagnosis: scalp laceration  Postoperative diagnosis: same   Procedure: repair of 10 cm scalp lac  Surgeon: Gurney Maxin, M.D.  Description: The laceration was irrigated with 567ml saline. Next a stapler was used to appose the skin edges. The patient tolerated the procedure well.  Findings: scalp laceration  Specimen: none  Implant: none   Blood loss: minimal  Complications: none  Gurney Maxin, M.D. General, Bariatric, & Minimally Invasive Surgery [facility]

## 2018-10-03 NOTE — ED Notes (Signed)
[facility] contacted; referral number [phone], spoke with Bethena Roys

## 2018-10-04 ENCOUNTER — Inpatient Hospital Stay: Payer: Self-pay

## 2018-10-04 LAB — CBC
HCT: 39.4 % (ref 39.0–52.0)
Hemoglobin: 13.6 g/dL (ref 13.0–17.0)
MCH: 32.9 pg (ref 26.0–34.0)
MCHC: 34.5 g/dL (ref 30.0–36.0)
MCV: 95.4 fL (ref 80.0–100.0)
Platelets: 189 10*3/uL (ref 150–400)
RBC: 4.13 MIL/uL — ABNORMAL LOW (ref 4.22–5.81)
RDW: 13.1 % (ref 11.5–15.5)
WBC: 2 10*3/uL — ABNORMAL LOW (ref 4.0–10.5)
nRBC: 0 % (ref 0.0–0.2)

## 2018-10-04 LAB — GLUCOSE, CAPILLARY
Glucose-Capillary: 99 mg/dL (ref 70–99)
Glucose-Capillary: 85 mg/dL (ref 70–99)
Glucose-Capillary: 83 mg/dL (ref 70–99)
Glucose-Capillary: 83 mg/dL (ref 70–99)
Glucose-Capillary: 87 mg/dL (ref 70–99)
Glucose-Capillary: 77 mg/dL (ref 70–99)

## 2018-10-04 LAB — BASIC METABOLIC PANEL
Anion gap: 10 (ref 5–15)
BUN: 12 mg/dL (ref 6–20)
CO2: 21 mmol/L — ABNORMAL LOW (ref 22–32)
Calcium: 8.1 mg/dL — ABNORMAL LOW (ref 8.9–10.3)
Chloride: 108 mmol/L (ref 98–111)
Creatinine, Ser: 0.93 mg/dL (ref 0.61–1.24)
GFR calc Af Amer: >60 mL/min (ref >60)
GFR calc non Af Amer: >60 mL/min (ref >60)
Glucose, Bld: 90 mg/dL (ref 70–99)
Potassium: 3.7 mmol/L (ref 3.5–5.1)
Sodium: 139 mmol/L (ref 135–145)

## 2018-10-04 LAB — POCT I-STAT 7, (LYTES, BLD GAS, ICA,H+H)
Acid-base deficit: 3 mmol/L — ABNORMAL HIGH (ref 0.0–2.0)
Bicarbonate: 21.8 mmol/L (ref 20.0–28.0)
Calcium, Ion: 1.18 mmol/L (ref 1.15–1.40)
HCT: 40 % (ref 39.0–52.0)
Hemoglobin: 13.6 g/dL (ref 13.0–17.0)
O2 Saturation: 96 %
Patient temperature: 97.5
Potassium: 3.8 mmol/L (ref 3.5–5.1)
Sodium: 139 mmol/L (ref 135–145)
TCO2: 23 mmol/L (ref 22–32)
pCO2 arterial: 35.7 mmHg (ref 32.0–48.0)
pH, Arterial: 7.392 (ref 7.350–7.450)
pO2, Arterial: 83 mmHg (ref 83.0–108.0)

## 2018-10-04 LAB — PHOSPHORUS
Phosphorus: 1.7 mg/dL — ABNORMAL LOW (ref 2.5–4.6)
Phosphorus: 3 mg/dL (ref 2.5–4.6)

## 2018-10-04 LAB — TRIGLYCERIDES: Triglycerides: 72 mg/dL (ref <150)

## 2018-10-04 LAB — MAGNESIUM
Magnesium: 1.5 mg/dL — ABNORMAL LOW (ref 1.7–2.4)
Magnesium: 1.6 mg/dL — ABNORMAL LOW (ref 1.7–2.4)

## 2018-10-04 MED ORDER — PIVOT 1.5 CAL PO LIQD
1000.0000 mL | ORAL | Status: DC
  Administered 2018-10-04: 21:00:00 1000 mL
  Filled 2018-10-04 (×2): qty 1000

## 2018-10-04 MED ORDER — PANTOPRAZOLE SODIUM 40 MG IV SOLR
40.0000 mg | INTRAVENOUS | Status: DC
  Administered 2018-10-04: 10:00:00 40 mg via INTRAVENOUS
  Filled 2018-10-04: qty 40

## 2018-10-04 MED ORDER — PHENYLEPHRINE HCL-NACL 10-0.9 MG/250ML-% IV SOLN
0.0000 ug/min | INTRAVENOUS | Status: DC
  Administered 2018-10-04: 20 ug/min via INTRAVENOUS
  Administered 2018-10-04: 19:00:00 40 ug/min via INTRAVENOUS
  Administered 2018-10-04: 60 ug/min via INTRAVENOUS
  Administered 2018-10-05: 09:00:00 40 ug/min via INTRAVENOUS
  Administered 2018-10-05: 70 ug/min via INTRAVENOUS
  Administered 2018-10-05: 20 ug/min via INTRAVENOUS
  Administered 2018-10-05 (×2): 70 ug/min via INTRAVENOUS
  Administered 2018-10-06: 175 ug/min via INTRAVENOUS
  Administered 2018-10-06: 11:00:00 20 ug/min via INTRAVENOUS
  Administered 2018-10-06: 60 ug/min via INTRAVENOUS
  Filled 2018-10-04: qty 500
  Filled 2018-10-04: qty 250
  Filled 2018-10-04: qty 500
  Filled 2018-10-04 (×3): qty 250
  Filled 2018-10-04: qty 500
  Filled 2018-10-04 (×2): qty 250

## 2018-10-04 MED ORDER — CHLORHEXIDINE GLUCONATE CLOTH 2 % EX PADS
6.0000 | MEDICATED_PAD | Freq: Every day | CUTANEOUS | Status: DC
  Administered 2018-10-05 – 2018-10-06 (×2): 6 via TOPICAL

## 2018-10-04 MED ORDER — PRO-STAT SUGAR FREE PO LIQD: 30.0000 mL | Freq: Two times a day (BID) | ORAL | Status: DC

## 2018-10-04 MED ORDER — PRO-STAT SUGAR FREE PO LIQD
60.0000 mL | Freq: Two times a day (BID) | ORAL | Status: DC
  Administered 2018-10-04 – 2018-10-06 (×4): 60 mL
  Filled 2018-10-04 (×4): qty 60

## 2018-10-04 MED ORDER — VITAL HIGH PROTEIN PO LIQD: 1000.0000 mL | ORAL | Status: DC

## 2018-10-04 MED ORDER — SODIUM CHLORIDE 0.9% FLUSH: 10.0000 mL | INTRAVENOUS | Status: DC | PRN

## 2018-10-04 NOTE — Progress Notes (Signed)
Patient ID: Ryan Cafiero Sr., male   DOB: 1970/12/06, 48 y.o.   MRN: [id number] Follow up - Trauma Critical Care  Patient Details:    Ryan Glassco Sr. is an 48 y.o. male.  Lines/tubes : Airway 7.5 mm (Active)  Secured at (cm) 25 cm 10/04/18 0758  Measured From Lips 10/04/18 0758  Secured Location Right 10/04/18 0758  Secured By [employer] 10/04/18 0758  Tube Holder Repositioned Yes 10/04/18 0758  Cuff Pressure (cm H2O) 28 cm H2O 10/04/18 0758  Site Condition Dry 10/04/18 0758     NG/OG Tube Orogastric 16 Fr. Center mouth Xray (Active)  Site Assessment Clean;Dry;Intact 10/04/18 0800  Ongoing Placement Verification No change in respiratory status;No acute changes, not attributed to clinical condition 10/04/18 0800  Status Clamped 10/04/18 0800     Urethral Catheter (Active)  Indication for Insertion or Continuance of Catheter Unstable critically ill patients first 24-48 hours (See Criteria) 10/04/18 0800  Site Assessment Clean;Dry;Intact 10/04/18 0800  Catheter Maintenance Bag below level of bladder;Catheter secured;Drainage bag/tubing not touching floor;No dependent loops;Seal intact;Insertion date on drainage bag;Bag emptied prior to transport 10/04/18 0800  Collection Container Standard drainage bag 10/04/18 0800  Securement Method Securing device (Describe) 10/04/18 0800  Urinary Catheter Interventions (if applicable) Unclamped 83/41/96 0800  Output (mL) 100 mL 10/04/18 0800    Microbiology/Sepsis markers: Results for orders placed or performed during the hospital encounter of 10-06-2018  SARS CORONAVIRUS 2 (TAT 6-24 HRS) Nasopharyngeal Nasopharyngeal Swab     Status: None   Collection Time: 2018-10-06  1:15 AM   Specimen: Nasopharyngeal Swab  Result Value Ref Range Status   SARS Coronavirus 2 NEGATIVE NEGATIVE Final    Comment: (NOTE) SARS-CoV-2 target nucleic acids are NOT DETECTED. The SARS-CoV-2 RNA is generally detectable in upper and lower respiratory  specimens during the acute phase of infection. Negative results do not preclude SARS-CoV-2 infection, do not rule out co-infections with other pathogens, and should not be used as the sole basis for treatment or other patient management decisions. Negative results must be combined with clinical observations, patient history, and epidemiological information. The expected result is Negative. Fact Sheet for Patients: SugarRoll.be Fact Sheet for Healthcare Providers: https://www.woods-mathews.com/ This test is not yet approved or cleared by the [place] FDA and  has been authorized for detection and/or diagnosis of SARS-CoV-2 by FDA under an Emergency Use Authorization (EUA). This EUA will remain  in effect (meaning this test can be used) for the duration of the COVID-19 declaration under Section 56 4(b)(1) of the Act, 21 U.S.C. section 360bbb-3(b)(1), unless the authorization is terminated or revoked sooner. Performed at [facility], [place] [address], [place], [place] [address]     Anti-infectives:  Anti-infectives (From admission, onward)   None      Best Practice/Protocols:  VTE Prophylaxis: Mechanical Continous Sedation  Consults: Treatment Team:  Kary Kos, MD    Studies:    Events:  Subjective:    Overnight Issues:   Objective:  Vital signs for last 24 hours: Temp:  [96.8 F (36 C)-101.8 F (38.8 C)] 97.2 F (36.2 C) (09/25 1000) Pulse Rate:  [98-138] 109 (09/25 1000) Resp:  [15-28] 18 (09/25 1000) BP: (88-173)/(66-107) 101/75 (09/25 1000) SpO2:  [89 %-100 %] 96 % (09/25 1000) FiO2 (%):  [40 %-60 %] 50 % (09/25 0758)  Hemodynamic parameters for last 24 hours:    Intake/Output from previous day: 09/24 0701 - 09/25 0700 In: 3993 [I.V.:3817.8; IV Piggyback:175.2] Out: 2000 [LGXQJ:1941]  Intake/Output this shift: Total I/O In: 400.8 [I.V.:400.8] Out: 100 [Urine:100]  Vent settings for last 24  hours: Vent Mode: PRVC FiO2 (%):  [40 %-60 %] 50 % Set Rate:  [16 bmp] 16 bmp Vt Set:  [620 mL] 620 mL PEEP:  [5 cmH20] 5 cmH20 Plateau Pressure:  [19 cmH20-26 cmH20] 19 cmH20  Physical Exam:  General: on vent Neuro: R pupil fixed and dilated, L pupil 5mm and fixed, no gag, no corneal, does breathe over vent HEENT/Neck: ETT and collar, facial abrasions and edema Resp: clear to auscultation bilaterally CVS: regular rate and rhythm, S1, S2 normal, no murmur, click, rub or gallop GI: soft, nontender, BS WNL, no r/g Extremities: edema 1+  Results for orders placed or performed during the hospital encounter of 2018-07-20 (from the past 24 hour(s))  Glucose, capillary     Status: Abnormal   Collection Time: 2018-07-20 11:52 AM  Result Value Ref Range   Glucose-Capillary 139 (H) 70 - 99 mg/dL  I-STAT 7, (LYTES, BLD GAS, ICA, H+H)     Status: Abnormal   Collection Time: 2018-07-20 12:08 PM  Result Value Ref Range   pH, Arterial 7.423 7.350 - 7.450   pCO2 arterial 32.8 32.0 - 48.0 mmHg   pO2, Arterial 141.0 (H) 83.0 - 108.0 mmHg   Bicarbonate 21.1 20.0 - 28.0 mmol/L   TCO2 22 22 - 32 mmol/L   O2 Saturation 99.0 %   Acid-base deficit 2.0 0.0 - 2.0 mmol/L   Sodium 140 135 - 145 mmol/L   Potassium 4.2 3.5 - 5.1 mmol/L   Calcium, Ion 1.12 (L) 1.15 - 1.40 mmol/L   HCT 38.0 (L) 39.0 - 52.0 %   Hemoglobin 12.9 (L) 13.0 - 17.0 g/dL   Patient temperature 53.638.7 C    Collection site RADIAL, ALLEN'S TEST ACCEPTABLE    Drawn by Operator    Sample type ARTERIAL   Glucose, capillary     Status: Abnormal   Collection Time: 2018-07-20  4:18 PM  Result Value Ref Range   Glucose-Capillary 106 (H) 70 - 99 mg/dL  Glucose, capillary     Status: None   Collection Time: 2018-07-20  7:43 PM  Result Value Ref Range   Glucose-Capillary 98 70 - 99 mg/dL  Glucose, capillary     Status: Abnormal   Collection Time: 2018-07-20 11:26 PM  Result Value Ref Range   Glucose-Capillary 103 (H) 70 - 99 mg/dL  Glucose,  capillary     Status: None   Collection Time: 10/04/18  3:24 AM  Result Value Ref Range   Glucose-Capillary 99 70 - 99 mg/dL  I-STAT 7, (LYTES, BLD GAS, ICA, H+H)     Status: Abnormal   Collection Time: 10/04/18  4:31 AM  Result Value Ref Range   pH, Arterial 7.392 7.350 - 7.450   pCO2 arterial 35.7 32.0 - 48.0 mmHg   pO2, Arterial 83.0 83.0 - 108.0 mmHg   Bicarbonate 21.8 20.0 - 28.0 mmol/L   TCO2 23 22 - 32 mmol/L   O2 Saturation 96.0 %   Acid-base deficit 3.0 (H) 0.0 - 2.0 mmol/L   Sodium 139 135 - 145 mmol/L   Potassium 3.8 3.5 - 5.1 mmol/L   Calcium, Ion 1.18 1.15 - 1.40 mmol/L   HCT 40.0 39.0 - 52.0 %   Hemoglobin 13.6 13.0 - 17.0 g/dL   Patient temperature 64.497.5 F    Collection site RADIAL, ALLEN'S TEST ACCEPTABLE    Drawn by RT    Sample type ARTERIAL  Triglycerides     Status: None   Collection Time: 10/04/18  6:00 AM  Result Value Ref Range   Triglycerides 72 <150 mg/dL  CBC     Status: Abnormal   Collection Time: 10/04/18  6:00 AM  Result Value Ref Range   WBC 2.0 (L) 4.0 - 10.5 K/uL   RBC 4.13 (L) 4.22 - 5.81 MIL/uL   Hemoglobin 13.6 13.0 - 17.0 g/dL   HCT 10.2 72.5 - 36.6 %   MCV 95.4 80.0 - 100.0 fL   MCH 32.9 26.0 - 34.0 pg   MCHC 34.5 30.0 - 36.0 g/dL   RDW 44.0 34.7 - 42.5 %   Platelets 189 150 - 400 K/uL   nRBC 0.0 0.0 - 0.2 %  Basic metabolic panel     Status: Abnormal   Collection Time: 10/04/18  6:00 AM  Result Value Ref Range   Sodium 139 135 - 145 mmol/L   Potassium 3.7 3.5 - 5.1 mmol/L   Chloride 108 98 - 111 mmol/L   CO2 21 (L) 22 - 32 mmol/L   Glucose, Bld 90 70 - 99 mg/dL   BUN 12 6 - 20 mg/dL   Creatinine, Ser 9.56 0.61 - 1.24 mg/dL   Calcium 8.1 (L) 8.9 - 10.3 mg/dL   GFR calc non Af Amer >60 >60 mL/min   GFR calc Af Amer >60 >60 mL/min   Anion gap 10 5 - 15  Glucose, capillary     Status: None   Collection Time: 10/04/18  8:21 AM  Result Value Ref Range   Glucose-Capillary 85 70 - 99 mg/dL    Assessment & Plan: Present on  Admission: . Head injury    LOS: 1 day   Additional comments:I reviewed the patient's new clinical lab test results. . Drive shaft fell on head Severe TBI/multifocal SAH/diffuse cerebral edema/tonsilar herniation/R frontal pneumocephalus - progressing to brain death, anticipate brain flow study tomorrow if stops breathing over vent, F/U CT yesterday reviewed with Dr. Wynetta Emery Acute hypercarbic ventilator dependent respiratory failure - PaCO2 dialed in at 35.7 Frontal/maxillary/sphenoid/ethmoid sinus FXs - dark blood and brain matter into mouth R orbit FX R pterygoid plate FX and skull base FX through sella turcica FEN - TF, Na [place] VTE - PAS DIspo - ICU Critical Care Total Time*: 40 Minutes  Violeta Gelinas, MD, MPH, FACS Trauma & General Surgery Use [email or web address] to contact on call provider  10/04/2018  *Care during the described time interval was provided by me. I have reviewed this patient's available data, including medical history, events of note, physical examination and test results as part of my evaluation.

## 2018-10-04 NOTE — Progress Notes (Signed)
Patient ID: Ryan Madariaga Sr., male   DOB: 20-Jun-1970, 48 y.o.   MRN: [id number] Patient with minimal exam fixed and dilated no corneals no gag does still breathe over the vent.  Repeat CT scan yesterday afternoon shows evolution of cerebral edema with infarcts.  Injury not compatible with functional survival.  I did talk to his wife and his son extensively and explained the poor prognosis.

## 2018-10-04 NOTE — Progress Notes (Signed)
Unable to contact wife for PICC insertion consent; machine says number is  not in service. Will try again later .

## 2018-10-04 NOTE — Progress Notes (Signed)
Pt afebrile, awaiting PICC to be placed today. IV team called RN to state that they were unable to get in contact with pt's wife for consent of the procedure at this time. IV team will hold off on placing PICC until a consent form can be signed. Pt VSS. RN will attempt to reach out to pt's wife as well so that the PICC can be placed today without delay.

## 2018-10-04 NOTE — Progress Notes (Signed)
Initial Nutrition Assessment  DOCUMENTATION CODES:   Not applicable  INTERVENTION:   Initiate Pivot 1.5 @ 50 ml/hr (1200 ml/day) via OG tube 60 ml Prostat BID  Provides: 2200 kcal, 172 grams protein, and 910 ml free water.  TF regimen and propofol at current rate providing 2543 total kcal/day    NUTRITION DIAGNOSIS:   Increased nutrient needs related to (TBI) as evidenced by estimated needs.  GOAL:   Patient will meet greater than or equal to 90% of their needs  MONITOR:   TF tolerance  REASON FOR ASSESSMENT:   Consult, Ventilator Enteral/tube feeding initiation and management  ASSESSMENT:   Pt admitted after drive shaft fell on head with severe TBI, multifocal SAH, diffuse cerebral edema, tonsillar herniation, R frontal pneumocephalus, frontal/maxillary/sphenoid/ethmoid fxs with brain matter into mouth, R orbit fx, R pterygoid plate fx and skull base fx.   Pt discussed during ICU rounds and with RN.  Plan for brain flow study tomorrow.   Patient is currently intubated on ventilator support MV: 11.3 L/min Temp (24hrs), Avg:98.6 F (37 C), Min:96.8 F (36 C), Max:101.8 F (38.8 C)  Propofol: 13 ml/hr (20 mcg) provides: 343 kcal  Medications reviewed and include: neo and propofol Labs reviewed    NUTRITION - FOCUSED PHYSICAL EXAM:    Most Recent Value  Orbital Region  Unable to assess  Upper Arm Region  No depletion  Thoracic and Lumbar Region  No depletion  Buccal Region  Unable to assess  Temple Region  Unable to assess  Clavicle Bone Region  No depletion  Clavicle and Acromion Bone Region  No depletion  Scapular Bone Region  Unable to assess  Dorsal Hand  No depletion  Patellar Region  No depletion  Anterior Thigh Region  No depletion  Posterior Calf Region  No depletion  Edema (RD Assessment)  Moderate  Hair  Unable to assess  Eyes  Unable to assess  Mouth  Unable to assess  Skin  Reviewed  Nails  Reviewed       Diet Order:   Diet Order             Diet NPO time specified  Diet effective now              EDUCATION NEEDS:   No education needs have been identified at this time  Skin:  Skin Assessment: Reviewed RN Assessment  Last BM:  unknown  Height:   Ht Readings from Last 1 Encounters:  09/11/2018 6' (1.829 m)    Weight:   Wt Readings from Last 1 Encounters:  09/30/2018 113.4 kg    Ideal Body Weight:  80.9 kg  BMI:  Body mass index is 33.91 kg/m.  Estimated Nutritional Needs:   Kcal:  2582  Protein:  170-225 grams  Fluid:  >2L/day   Maylon Peppers RD, LDN, [place] Pager [phone] After Hours Pager

## 2018-10-04 NOTE — Progress Notes (Signed)
Peripherally Inserted Central Catheter/Midline Placement  The IV Nurse has discussed with the patient and/or persons authorized to consent for the patient, the purpose of this procedure and the potential benefits and risks involved with this procedure.  The benefits include less needle sticks, lab draws from the catheter, and the patient may be discharged home with the catheter. Risks include, but not limited to, infection, bleeding, blood clot (thrombus formation), and puncture of an artery; nerve damage and irregular heartbeat and possibility to perform a PICC exchange if needed/ordered by physician.  Alternatives to this procedure were also discussed.  Bard Power PICC patient education guide, fact sheet on infection prevention and patient information card has been provided to patient /or left at bedside.    PICC/Midline Placement Documentation  PICC Triple Lumen 97/02/63 PICC Right Basilic 44 cm 0 cm (Active)  Indication for Insertion or Continuance of Line Prolonged intravenous therapies 10/04/18 1253  Exposed Catheter (cm) 0 cm 10/04/18 1253  Site Assessment Clean;Dry;Intact 10/04/18 1253  Lumen #1 Status Flushed;Saline locked;Blood return noted 10/04/18 1253  Lumen #2 Status Flushed;Saline locked;Blood return noted 10/04/18 1253  Lumen #3 Status Flushed;Saline locked;Blood return noted 10/04/18 1253  Dressing Type Transparent;Securing device 10/04/18 1253  Dressing Status Clean;Dry;Intact;Antimicrobial disc in place 10/04/18 1253  Dressing Intervention New dressing 10/04/18 1253  Dressing Change Due 10/11/18 10/04/18 [place], Symeon Puleo 10/04/2018, 12:55 PM

## 2018-10-04 NOTE — Progress Notes (Signed)
New onset of desat, RT called, waited 15 min with no response. MD notified and RN told to increase FiO2 to 50%.  WCTM

## 2018-10-05 ENCOUNTER — Inpatient Hospital Stay (HOSPITAL_COMMUNITY): Payer: Medicaid Other

## 2018-10-05 LAB — BLOOD GAS, ARTERIAL
Acid-base deficit: 0.4 mmol/L (ref 0.0–2.0)
Bicarbonate: 24.8 mmol/L (ref 20.0–28.0)
Drawn by: 560021
FIO2: 50
MECHVT: 620 mL
O2 Saturation: 94.8 %
PEEP: 5 cmH2O
Patient temperature: 97.8
RATE: 16 resp/min
pCO2 arterial: 47.8 mmHg (ref 32.0–48.0)
pH, Arterial: 7.333 — ABNORMAL LOW (ref 7.350–7.450)
pO2, Arterial: 76 mmHg — ABNORMAL LOW (ref 83.0–108.0)

## 2018-10-05 LAB — PHOSPHORUS
Phosphorus: 2.4 mg/dL — ABNORMAL LOW (ref 2.5–4.6)
Phosphorus: 2.4 mg/dL — ABNORMAL LOW (ref 2.5–4.6)

## 2018-10-05 LAB — GLUCOSE, CAPILLARY
Glucose-Capillary: 87 mg/dL (ref 70–99)
Glucose-Capillary: 77 mg/dL (ref 70–99)
Glucose-Capillary: 83 mg/dL (ref 70–99)
Glucose-Capillary: 79 mg/dL (ref 70–99)
Glucose-Capillary: 79 mg/dL (ref 70–99)
Glucose-Capillary: 87 mg/dL (ref 70–99)

## 2018-10-05 LAB — CBC
HCT: 29.9 % — ABNORMAL LOW (ref 39.0–52.0)
Hemoglobin: 10.1 g/dL — ABNORMAL LOW (ref 13.0–17.0)
MCH: 33.3 pg (ref 26.0–34.0)
MCHC: 33.8 g/dL (ref 30.0–36.0)
MCV: 98.7 fL (ref 80.0–100.0)
Platelets: 158 10*3/uL (ref 150–400)
RBC: 3.03 MIL/uL — ABNORMAL LOW (ref 4.22–5.81)
RDW: 13.6 % (ref 11.5–15.5)
WBC: 6 10*3/uL (ref 4.0–10.5)
nRBC: 0 % (ref 0.0–0.2)

## 2018-10-05 LAB — BASIC METABOLIC PANEL
Anion gap: 7 (ref 5–15)
BUN: 16 mg/dL (ref 6–20)
CO2: 23 mmol/L (ref 22–32)
Calcium: 7.1 mg/dL — ABNORMAL LOW (ref 8.9–10.3)
Chloride: 113 mmol/L — ABNORMAL HIGH (ref 98–111)
Creatinine, Ser: 1 mg/dL (ref 0.61–1.24)
GFR calc Af Amer: >60 mL/min (ref >60)
GFR calc non Af Amer: >60 mL/min (ref >60)
Glucose, Bld: 81 mg/dL (ref 70–99)
Potassium: 3.9 mmol/L (ref 3.5–5.1)
Sodium: 143 mmol/L (ref 135–145)

## 2018-10-05 LAB — TRIGLYCERIDES: Triglycerides: 365 mg/dL — ABNORMAL HIGH (ref <150)

## 2018-10-05 LAB — MAGNESIUM
Magnesium: 1.6 mg/dL — ABNORMAL LOW (ref 1.7–2.4)
Magnesium: 2.3 mg/dL (ref 1.7–2.4)

## 2018-10-05 MED ORDER — DOCUSATE SODIUM 50 MG/5ML PO LIQD
100.0000 mg | Freq: Every day | ORAL | Status: DC
  Administered 2018-10-05 – 2018-10-06 (×2): 100 mg
  Filled 2018-10-05 (×2): qty 10

## 2018-10-05 MED ORDER — TECHNETIUM TC 99M EXAMETAZIME IV KIT
21.0000 | PACK | Freq: Once | INTRAVENOUS | Status: AC | PRN
  Administered 2018-10-05: 21 via INTRAVENOUS

## 2018-10-05 MED ORDER — PANTOPRAZOLE SODIUM 40 MG PO PACK
40.0000 mg | PACK | Freq: Every day | ORAL | Status: DC
  Administered 2018-10-05 – 2018-10-06 (×2): 40 mg
  Filled 2018-10-05 (×2): qty 20

## 2018-10-05 NOTE — Progress Notes (Signed)
Changed pt's ETT holder. It was starting to overlap his eyes. Assisted by RN without complications.

## 2018-10-05 NOTE — Progress Notes (Signed)
Pt was taken down to Nuclear Med and back to 0T88 without complication.

## 2018-10-05 NOTE — Progress Notes (Signed)
Subjective/Chief Complaint: No acute changes On pressors    Objective: Vital signs in last 24 hours: Temp:  [97.3 F (36.3 C)-99.5 F (37.5 C)] 98.8 F (37.1 C) (09/26 1145) Pulse Rate:  [94-124] 119 (09/26 1145) Resp:  [16-21] 16 (09/26 1145) BP: (78-117)/(54-76) 107/60 (09/26 1144) SpO2:  [87 %-97 %] 97 % (09/26 1145) FiO2 (%):  [40 %-70 %] 70 % (09/26 1144) Weight:  [93.9 kg] 93.9 kg (09/26 0500)    Intake/Output from previous day: 09/25 0701 - 09/26 0700 In: 3993.9 [I.V.:3507.3; NG/GT:486.7] Out: 2000 [Urine:2000] Intake/Output this shift: Total I/O In: 732.8 [I.V.:582.8; NG/GT:150] Out: 900 [Urine:900]  Exam: On vent Pupils remain fixed and dilated Lungs clear c collar in place Abdomen soft, ND   Lab Results:  Recent Labs    10/04/18 0600 10/05/18 0711  WBC 2.0* 6.0  HGB 13.6 10.1*  HCT 39.4 29.9*  PLT 189 158   BMET Recent Labs    10/04/18 0600 10/05/18 0530  NA 139 143  K 3.7 3.9  CL 108 113*  CO2 21* 23  GLUCOSE 90 81  BUN 12 16  CREATININE 0.93 1.00  CALCIUM 8.1* 7.1*   PT/INR Recent Labs    10/08/2018 0038  LABPROT 14.0  INR 1.1   ABG Recent Labs    10/04/18 0431 10/05/18 0400  PHART 7.392 7.333*  HCO3 21.8 24.8    Studies/Results: Ct Head Wo Contrast  Result Date: 09/10/2018 CLINICAL DATA:  Head trauma, neuro decline, follow-up. Neuro decline, new onset neuro changes. EXAM: CT HEAD WITHOUT CONTRAST TECHNIQUE: Contiguous axial images were obtained from the base of the skull through the vertex without intravenous contrast. COMPARISON:  Noncontrast head CT, maxillofacial CT earlier 09/22/2018 FINDINGS: Brain: Motion degraded examination. There has been interval development of moderate hypodensity with multiple small foci of hemorrhage within the anterior right frontal lobe consistent with hemorrhagic contusion. An additional 9 mm hypodensity within the right frontal lobe more posteriorly is also new from prior exam and may  reflect an additional contusion (series 3, image 28) (series 5, image 34). Tiny acute infarct not excluded A smaller hypodensity more posteriorly within the right frontal lobe convexity is also new from prior examination and may reflect an additional small contusion, small infarct not excluded (series 3, image 28). Redemonstrated diffuse subarachnoid hemorrhage, greatest along the left frontoparietal convexity. As before, there is diffuse cerebral edema with diffuse cerebral sulcal effacement, effacement of the basilar cisterns and inferior displacement of the cerebellar tonsils likely reflecting tonsillar herniation. No midline shift. As before, the anterior right frontal lobe abuts, and may extend partially into a right frontal calvarial fracture. Vascular: No definite hyperdense vessel. Skull: Multiple calvarial, skull base, orbital and maxillofacial fractures as previously detailed. Complete opacification of the right frontal sinus. Extensive partial opacification of ethmoid air cells. Complete opacification of the sphenoid and right maxillary sinuses. Left maxillary sinus air-fluid level. Sinuses/Orbits: Again demonstrated, there are small displaced fracture fragments and gas within the right orbit. Redemonstrated mild right proptosis with overlying periorbital soft tissue swelling/hematoma. Fluid again present within right mastoid air cells. Other: Partially visualized support tubes. Large defect within the nasal septum. The impression section below will be called to the ordering clinician or representative by the Radiologist Assistant, and communication documented in the PACS or zVision Dashboard. IMPRESSION: 1. Interval demarcation of moderate hypodensity within the anterior right frontal lobe with superimposed small foci of acute hemorrhage, findings consistent with hemorrhagic contusion. 2. Additional new 9 mm hypodensity within  the right frontal lobe more posteriorly which may reflect an additional  contusion. Tiny acute infarct not excluded. 3. As before, there is diffuse cerebral edema with diffuse sulcal effacement, effacement of the basilar cisterns and inferior displacement of the cerebellar tonsils likely reflecting tonsillar herniation. 4. Diffuse subarachnoid hemorrhage greatest overlying the left frontoparietal convexity, similar to prior exam. 5. Additional findings without significant interval change as detailed within the body of the report. Electronically Signed   By: Kellie Simmering   On: 10/04/2018 16:07   [place] Ekg Site Rite  Result Date: 10/04/2018 If Site Rite image not attached, placement could not be confirmed due to current cardiac rhythm.   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

## 2018-10-05 NOTE — Progress Notes (Signed)
Patient ID: Ryan Wilczak Sr., male   DOB: 05/26/1970, 48 y.o.   MRN: [id number] No change in exam, prognosis for functional recovery poor

## 2018-10-06 ENCOUNTER — Inpatient Hospital Stay (HOSPITAL_COMMUNITY): Payer: Medicaid Other

## 2018-10-06 LAB — GLUCOSE, CAPILLARY
Glucose-Capillary: 51 mg/dL — ABNORMAL LOW (ref 70–99)
Glucose-Capillary: 70 mg/dL (ref 70–99)
Glucose-Capillary: 75 mg/dL (ref 70–99)
Glucose-Capillary: 77 mg/dL (ref 70–99)
Glucose-Capillary: 81 mg/dL (ref 70–99)
Glucose-Capillary: 92 mg/dL (ref 70–99)

## 2018-10-06 LAB — POCT I-STAT 7, (LYTES, BLD GAS, ICA,H+H)
Acid-base deficit: 1 mmol/L (ref 0.0–2.0)
Acid-base deficit: 1 mmol/L (ref 0.0–2.0)
Bicarbonate: 30.9 mmol/L — ABNORMAL HIGH (ref 20.0–28.0)
Bicarbonate: 29.5 mmol/L — ABNORMAL HIGH (ref 20.0–28.0)
Bicarbonate: 30.8 mmol/L — ABNORMAL HIGH (ref 20.0–28.0)
Calcium, Ion: 1.36 mmol/L (ref 1.15–1.40)
Calcium, Ion: 1.36 mmol/L (ref 1.15–1.40)
Calcium, Ion: 1.38 mmol/L (ref 1.15–1.40)
HCT: 35 % — ABNORMAL LOW (ref 39.0–52.0)
HCT: 33 % — ABNORMAL LOW (ref 39.0–52.0)
HCT: 36 % — ABNORMAL LOW (ref 39.0–52.0)
Hemoglobin: 11.9 g/dL — ABNORMAL LOW (ref 13.0–17.0)
Hemoglobin: 11.2 g/dL — ABNORMAL LOW (ref 13.0–17.0)
Hemoglobin: 12.2 g/dL — ABNORMAL LOW (ref 13.0–17.0)
O2 Saturation: 57 %
O2 Saturation: 51 %
O2 Saturation: 52 %
Patient temperature: 37.9
Patient temperature: 37.7
Patient temperature: 37.9
Potassium: 3.4 mmol/L — ABNORMAL LOW (ref 3.5–5.1)
Potassium: 3.3 mmol/L — ABNORMAL LOW (ref 3.5–5.1)
Potassium: 3.3 mmol/L — ABNORMAL LOW (ref 3.5–5.1)
Sodium: 157 mmol/L — ABNORMAL HIGH (ref 135–145)
Sodium: 156 mmol/L — ABNORMAL HIGH (ref 135–145)
Sodium: 157 mmol/L — ABNORMAL HIGH (ref 135–145)
TCO2: 34 mmol/L — ABNORMAL HIGH (ref 22–32)
TCO2: 32 mmol/L (ref 22–32)
TCO2: 34 mmol/L — ABNORMAL HIGH (ref 22–32)
pCO2 arterial: 90.5 mmHg (ref 32.0–48.0)
pCO2 arterial: 83.6 mmHg (ref 32.0–48.0)
pCO2 arterial: 95 mmHg (ref 32.0–48.0)
pH, Arterial: 7.147 — CL (ref 7.350–7.450)
pH, Arterial: 7.124 — CL (ref 7.350–7.450)
pH, Arterial: 7.16 — CL (ref 7.350–7.450)
pO2, Arterial: 42 mmHg — ABNORMAL LOW (ref 83.0–108.0)
pO2, Arterial: 38 mmHg — CL (ref 83.0–108.0)
pO2, Arterial: 40 mmHg — CL (ref 83.0–108.0)

## 2018-10-06 LAB — TRIGLYCERIDES: Triglycerides: 468 mg/dL — ABNORMAL HIGH (ref <150)

## 2018-10-06 MED ORDER — METOPROLOL TARTRATE 5 MG/5ML IV SOLN: 5.0000 mg | Freq: Four times a day (QID) | INTRAVENOUS | Status: DC

## 2018-10-06 MED ORDER — DEXTROSE 50 % IV SOLN
INTRAVENOUS | Status: AC
  Administered 2018-10-06: 50 mL
  Filled 2018-10-06: qty 50

## 2018-10-06 MED ORDER — METOPROLOL TARTRATE 5 MG/5ML IV SOLN
10.0000 mg | Freq: Four times a day (QID) | INTRAVENOUS | Status: DC
  Administered 2018-10-06: 11:00:00 10 mg via INTRAVENOUS
  Filled 2018-10-06: qty 10

## 2018-10-06 MED ORDER — NOREPINEPHRINE 4 MG/250ML-% IV SOLN
0.0000 ug/min | INTRAVENOUS | Status: DC
  Administered 2018-10-06: 22:00:00 2 ug/min via INTRAVENOUS
  Administered 2018-10-07: 05:00:00 48 ug/min via INTRAVENOUS
  Administered 2018-10-07: 03:00:00 56 ug/min via INTRAVENOUS
  Administered 2018-10-07: 02:00:00 44 ug/min via INTRAVENOUS
  Filled 2018-10-06 (×4): qty 250

## 2018-10-06 MED ORDER — PHENYLEPHRINE HCL-NACL 40-0.9 MG/250ML-% IV SOLN
0.0000 ug/min | INTRAVENOUS | Status: DC
  Administered 2018-10-06: 18:00:00 80 ug/min via INTRAVENOUS
  Administered 2018-10-06: 310 ug/min via INTRAVENOUS
  Administered 2018-10-07 (×2): 400 ug/min via INTRAVENOUS
  Administered 2018-10-07: 420 ug/min via INTRAVENOUS
  Administered 2018-10-07: 05:00:00 410 ug/min via INTRAVENOUS
  Filled 2018-10-06 (×8): qty 250

## 2018-10-06 NOTE — Progress Notes (Signed)
Patient ID: Ryan Givan Sr., male   DOB: [birth year], 48 y.o.   MRN: [id number]  After multiple attempts, I was finally able to speak to his wife and son.  I updated them on the bleak nature of his current situation.  We discussed code status.  At this time, they want him to remain a full code.  They are driving up from [place].  I spoke with nursing about allowing the wife, the son, and his wife to visit the patient as I believe he may not survive until morning.  [name]. Georgette Dover, MD, [facility] Beeper [phone]  10/06/2018 11:06 PM

## 2018-10-06 NOTE — Progress Notes (Signed)
RT called regarding desaturation into the 80's. Recruitment maneuver done without improvement. Peep increased to 10cmH20 per verbal order from Dr. Georgette Dover. Peep increased again to 12cmH20 to help with oxygenation. Another recruitment maneuver done, again no improvement. Stat ABG and chest Xray done. RT will continue to monitor.

## 2018-10-06 NOTE — Progress Notes (Signed)
Pt's oxygen saturations continue to decline despite interventions by RT. Currently on 100% Fi02 and 12 of PEEP with SpO2 of 83%. Neo increased significantly d/t hypotension in past hour. No urine output observed since start of shift. Dr. Georgette Dover paged.

## 2018-10-06 NOTE — Progress Notes (Signed)
Patient ID: Ryan KyleMichael Otte Sr., male   DOB: 1970/02/05, 48 y.o.   MRN: [id number]  Follow up - Trauma and Critical Care  Patient Details:    Ryan KyleMichael Zalar Sr. is an 48 y.o. male.  Lines/tubes : Airway 7.5 mm (Active)  Secured at (cm) 25 cm 10/06/18 0759  Measured From Lips 10/06/18 0759  Secured Location Right 10/06/18 0759  Secured By [employer] Tube Holder 10/06/18 0759  Tube Holder Repositioned Yes 10/06/18 0759  Cuff Pressure (cm H2O) 28 cm H2O 10/06/18 0759  Site Condition Dry 10/06/18 0759     PICC Triple Lumen 10/04/18 PICC Right Basilic 44 cm 0 cm (Active)  Indication for Insertion or Continuance of Line Prolonged intravenous therapies 10/06/18 0800  Exposed Catheter (cm) 0 cm 10/04/18 1253  Site Assessment Clean;Dry;Intact 10/06/18 0800  Lumen #1 Status In-line blood sampling system in place;Flushed;Blood return noted;Infusing;Other (Comment) 10/06/18 0800  Lumen #2 Status Infusing 10/06/18 0800  Lumen #3 Status Infusing 10/06/18 0800  Dressing Type Transparent;Securing device 10/06/18 0800  Dressing Status Clean;Dry;Intact;Antimicrobial disc in place 10/06/18 0800  Line Care Connections checked and tightened 10/06/18 0800  Dressing Intervention New dressing 10/04/18 1253  Dressing Change Due 10/11/18 10/06/18 0800     NG/OG Tube Orogastric 16 Fr. Center mouth Xray (Active)  External Length of Tube (cm) - (if applicable) 62 cm 10/06/18 0800  Site Assessment Clean;Dry;Intact 10/06/18 0800  Ongoing Placement Verification No change in respiratory status;No acute changes, not attributed to clinical condition 10/06/18 0800  Status Infusing tube feed 10/06/18 0800  Output (mL) 0 mL 10/04/18 1700     Urethral Catheter (Active)  Indication for Insertion or Continuance of Catheter End of life comfort care;Bladder outlet obstruction / other urologic reason 10/06/18 0800  Site Assessment Clean;Intact;Dry 10/06/18 0800  Catheter Maintenance Bag below level of bladder;Catheter  secured;Insertion date on drainage bag;Drainage bag/tubing not touching floor;No dependent loops;Seal intact;Bag emptied prior to transport 10/06/18 0800  Collection Container Standard drainage bag 10/06/18 0800  Securement Method Securing device (Describe) 10/06/18 0800  Urinary Catheter Interventions (if applicable) Unclamped 10/06/18 0800  Output (mL) 200 mL 10/06/18 0900    Microbiology/Sepsis markers: Results for orders placed or performed during the hospital encounter of 10-24-18  SARS CORONAVIRUS 2 (TAT 6-24 HRS) Nasopharyngeal Nasopharyngeal Swab     Status: None   Collection Time: 10-24-18  1:15 AM   Specimen: Nasopharyngeal Swab  Result Value Ref Range Status   SARS Coronavirus 2 NEGATIVE NEGATIVE Final    Comment: (NOTE) SARS-CoV-2 target nucleic acids are NOT DETECTED. The SARS-CoV-2 RNA is generally detectable in upper and lower respiratory specimens during the acute phase of infection. Negative results do not preclude SARS-CoV-2 infection, do not rule out co-infections with other pathogens, and should not be used as the sole basis for treatment or other patient management decisions. Negative results must be combined with clinical observations, patient history, and epidemiological information. The expected result is Negative. Fact Sheet for Patients: HairSlick.nohttps://www.fda.gov/media/138098/download Fact Sheet for Healthcare Providers: quierodirigir.comhttps://www.fda.gov/media/138095/download This test is not yet approved or cleared by the [place] FDA and  has been authorized for detection and/or diagnosis of SARS-CoV-2 by FDA under an Emergency Use Authorization (EUA). This EUA will remain  in effect (meaning this test can be used) for the duration of the COVID-19 declaration under Section 56 4(b)(1) of the Act, 21 U.S.C. section 360bbb-3(b)(1), unless the authorization is terminated or revoked sooner. Performed at [facility], [address] [place], [place] [address]  Anti-infectives:  Anti-infectives (From admission, onward)   None      Best Practice/Protocols:  VTE Prophylaxis: Mechanical Continous Sedation  Consults: Treatment Team:  Kary Kos, MD    Events:  Chief Complaint/Subjective:    Overnight Issues: Brain flow study shows flow to the brain GCS 3 - breathes over the vent occasionally Persistently tachycardic 120-130's Back up to 90% FiO2 for sats in the low 90's  Objective:  Vital signs for last 24 hours: Temp:  [97.3 F (36.3 C)-100.6 F (38.1 C)] 98.1 F (36.7 C) (09/27 0900) Pulse Rate:  [113-139] 121 (09/27 0900) Resp:  [15-22] 17 (09/27 0900) BP: (86-140)/(52-84) 105/64 (09/27 0900) SpO2:  [89 %-97 %] 96 % (09/27 0900) FiO2 (%):  [70 %-90 %] 90 % (09/27 0759) Weight:  [93.6 kg] 93.6 kg (09/27 0408)  Hemodynamic parameters for last 24 hours:    Intake/Output from previous day: 09/26 0701 - 09/27 0700 In: 3254.5 [I.V.:2054.5; NG/GT:1200] Out: 2750 [Urine:2750]  Intake/Output this shift: Total I/O In: 237.3 [I.V.:137.3; NG/GT:100] Out: 200 [Urine:200]  Vent settings for last 24 hours: Vent Mode: PRVC FiO2 (%):  [70 %-90 %] 90 % Set Rate:  [16 bmp] 16 bmp Vt Set:  [620 mL] 620 mL PEEP:  [5 cmH20] 5 cmH20 Plateau Pressure:  [19 cmH20-26 cmH20] 19 cmH20  Physical Exam:  General: on vent Neuro: R pupil fixed and dilated, L pupil fixed and dilated, no gag, no corneal, does breathe over vent HEENT/Neck: ETT and collar, facial abrasions and edema Resp: clear to auscultation bilaterally CVS: regular rate and rhythm, S1, S2 normal, no murmur, click, rub or gallop GI: soft, nontender, BS WNL, no r/g Extremities: edema 1+  Results for orders placed or performed during the hospital encounter of 09/19/2018 (from the past 24 hour(s))  Glucose, capillary     Status: None   Collection Time: 10/05/18 11:38 AM  Result Value Ref Range   Glucose-Capillary 83 70 - 99 mg/dL   Comment 1 Notify RN    Comment 2  Document in Chart   Glucose, capillary     Status: None   Collection Time: 10/05/18  4:15 PM  Result Value Ref Range   Glucose-Capillary 79 70 - 99 mg/dL   Comment 1 Notify RN    Comment 2 Document in Chart   Magnesium     Status: None   Collection Time: 10/05/18  4:25 PM  Result Value Ref Range   Magnesium 2.3 1.7 - 2.4 mg/dL  Phosphorus     Status: Abnormal   Collection Time: 10/05/18  4:25 PM  Result Value Ref Range   Phosphorus 2.4 (L) 2.5 - 4.6 mg/dL  Glucose, capillary     Status: None   Collection Time: 10/05/18  7:30 PM  Result Value Ref Range   Glucose-Capillary 79 70 - 99 mg/dL  Glucose, capillary     Status: None   Collection Time: 10/05/18 11:04 PM  Result Value Ref Range   Glucose-Capillary 87 70 - 99 mg/dL  Glucose, capillary     Status: None   Collection Time: 10/06/18  3:19 AM  Result Value Ref Range   Glucose-Capillary 77 70 - 99 mg/dL  Glucose, capillary     Status: None   Collection Time: 10/06/18  7:29 AM  Result Value Ref Range   Glucose-Capillary 75 70 - 99 mg/dL   Comment 1 Notify RN    Comment 2 Document in Chart   Triglycerides     Status: Abnormal  Collection Time: 10/06/18  7:50 AM  Result Value Ref Range   Triglycerides 468 (H) <150 mg/dL     Assessment/Plan:  Drive shaft fell on head 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 Wynetta Emery Acute hypercarbic ventilator dependent respiratory failure - Increase PEEP to 8 Frontal/maxillary/sphenoid/ethmoid sinus FXs - dark blood and brain matter into mouth R orbit FX R pterygoid plate FX and skull base FX through sella turcica FEN - TF, Na OK VTE - PAS DIspo - ICU  LOS: 3 days   Additional comments:None  Critical Care Total Time*: 30 Minutes  Wynona Luna 10/06/2018  *Care during the described time interval was provided by me and/or other providers on the critical care team.  I have  reviewed this patient's available data, including medical history, events of note, physical examination and test results as part of my evaluation.

## 2018-10-06 NOTE — Plan of Care (Signed)
Tube feed at goal rate, pt tolerating.

## 2018-10-06 NOTE — Progress Notes (Signed)
Patient ID: Ryan Launer Sr., male   DOB: September 01, 1970, 48 y.o.   MRN: [id number] No change in exam, prognosis remains poor

## 2018-10-06 NOTE — Progress Notes (Signed)
Called Dr. Georgette Dover regarding pt's Sp02 in the 80s on 100% Fi02 and HR in the 130s. Orders received include: increasing PEEP from 8 to 10, modify metoprolol order from 10mg  to 5mg  d/t the full dose causing hypotension, and labs.

## 2018-10-06 NOTE — Progress Notes (Signed)
Patient ID: Ryan Miklos Sr., male   DOB: [birth year], 48 y.o.   MRN: [id number]   Through the course of the day, the patient has become significantly more difficult to oxygenate and ventilate.  We have adjusted the vent by increasing PEEP and maintaining 100 FiO2.  No significant improvement.  He has also become hypotensive, requiring Neo and the addition of Levophed.  I am attempting to contact family to update them on the situation and to discuss code status.  His neurologic function remains poor with only occasional spontaneous breathing over vent.  Ryan Burn. Georgette Dover, MD, [facility]  General/ Trauma Surgery Beeper [phone]  10/06/2018 10:40 PM

## 2018-10-07 MED ORDER — ONDANSETRON HCL 4 MG/2ML IJ SOLN: 4.0000 mg | Freq: Four times a day (QID) | INTRAMUSCULAR | Status: DC | PRN

## 2018-10-07 MED ORDER — HALOPERIDOL 0.5 MG PO TABS
0.5000 mg | ORAL_TABLET | ORAL | Status: DC | PRN
  Filled 2018-10-07: qty 1

## 2018-10-07 MED ORDER — VASOPRESSIN 20 UNIT/ML IV SOLN
0.0300 [IU]/min | INTRAVENOUS | Status: DC
  Administered 2018-10-07: 02:00:00 0.03 [IU]/min via INTRAVENOUS
  Filled 2018-10-07: qty 2

## 2018-10-07 MED ORDER — GLYCOPYRROLATE 1 MG PO TABS: 1.0000 mg | ORAL_TABLET | ORAL | Status: DC | PRN

## 2018-10-07 MED ORDER — ACETAMINOPHEN 325 MG PO TABS: 650.0000 mg | ORAL_TABLET | Freq: Four times a day (QID) | ORAL | Status: DC | PRN

## 2018-10-07 MED ORDER — GLYCOPYRROLATE 0.2 MG/ML IJ SOLN: 0.2000 mg | INTRAMUSCULAR | Status: DC | PRN

## 2018-10-07 MED ORDER — ACETAMINOPHEN 650 MG RE SUPP: 650.0000 mg | Freq: Four times a day (QID) | RECTAL | Status: DC | PRN

## 2018-10-07 MED ORDER — MORPHINE 100MG IN NS 100ML (1MG/ML) PREMIX INFUSION: 0.0000 mg/h | INTRAVENOUS | Status: DC

## 2018-10-07 MED ORDER — ONDANSETRON 4 MG PO TBDP: 4.0000 mg | ORAL_TABLET | Freq: Four times a day (QID) | ORAL | Status: DC | PRN

## 2018-10-07 MED ORDER — MORPHINE BOLUS VIA INFUSION
1.0000 mg | INTRAVENOUS | Status: DC | PRN
  Filled 2018-10-07: qty 1

## 2018-10-07 MED ORDER — HALOPERIDOL LACTATE 2 MG/ML PO CONC
0.5000 mg | ORAL | Status: DC | PRN
  Filled 2018-10-07: qty 0.3

## 2018-10-07 MED ORDER — BIOTENE DRY MOUTH MT LIQD: 15.0000 mL | OROMUCOSAL | Status: DC | PRN

## 2018-10-07 MED ORDER — HALOPERIDOL LACTATE 5 MG/ML IJ SOLN: 0.5000 mg | INTRAMUSCULAR | Status: DC | PRN

## 2018-10-07 MED ORDER — POLYVINYL ALCOHOL 1.4 % OP SOLN: 1.0000 [drp] | Freq: Four times a day (QID) | OPHTHALMIC | Status: DC | PRN

## 2018-10-10 NOTE — Progress Notes (Signed)
Time of death 0610, confirmed with 2nd RN. Dr. Georgette Dover notified.

## 2018-10-10 NOTE — Procedures (Signed)
Extubation Procedure Note  Patient Details:   Name: Ryan Gingras Sr. DOB: 1970/03/01 MRN: [id number]   Airway Documentation:    Vent end date: 2018-10-31 Vent end time: 0559   Evaluation   Pt terminally extubated per MD order.   Mariam Dollar October 31, 2018, 5:59 AM

## 2018-10-10 NOTE — Progress Notes (Signed)
Family present at bedside due to pt's rapid decline overnight. They have discussed among themselves and wish to proceed with compassionate extubation. Dr. Georgette Dover notified who gave the verbal order to extubate. RT notified. Emotional support provided to family.

## 2018-10-10 NOTE — Progress Notes (Addendum)
Notified CDS of cardiac time of death. [id number] Spoke with Gentry Fitz

## 2018-10-10 DEATH — deceased
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
## 2020-02-29 IMAGING — DX DG CHEST 1V PORT
1 series · 2 of 2 positions shown · non-contrast
Comparison: Radiograph 10/03/2018

CLINICAL DATA: O2 desaturation, recent heart catheterization

EXAM:
PORTABLE CHEST 1 VIEW

[Series 1: chest ap · 0.14mm/px · 2 of 2 slices shown]
[im 1/2]
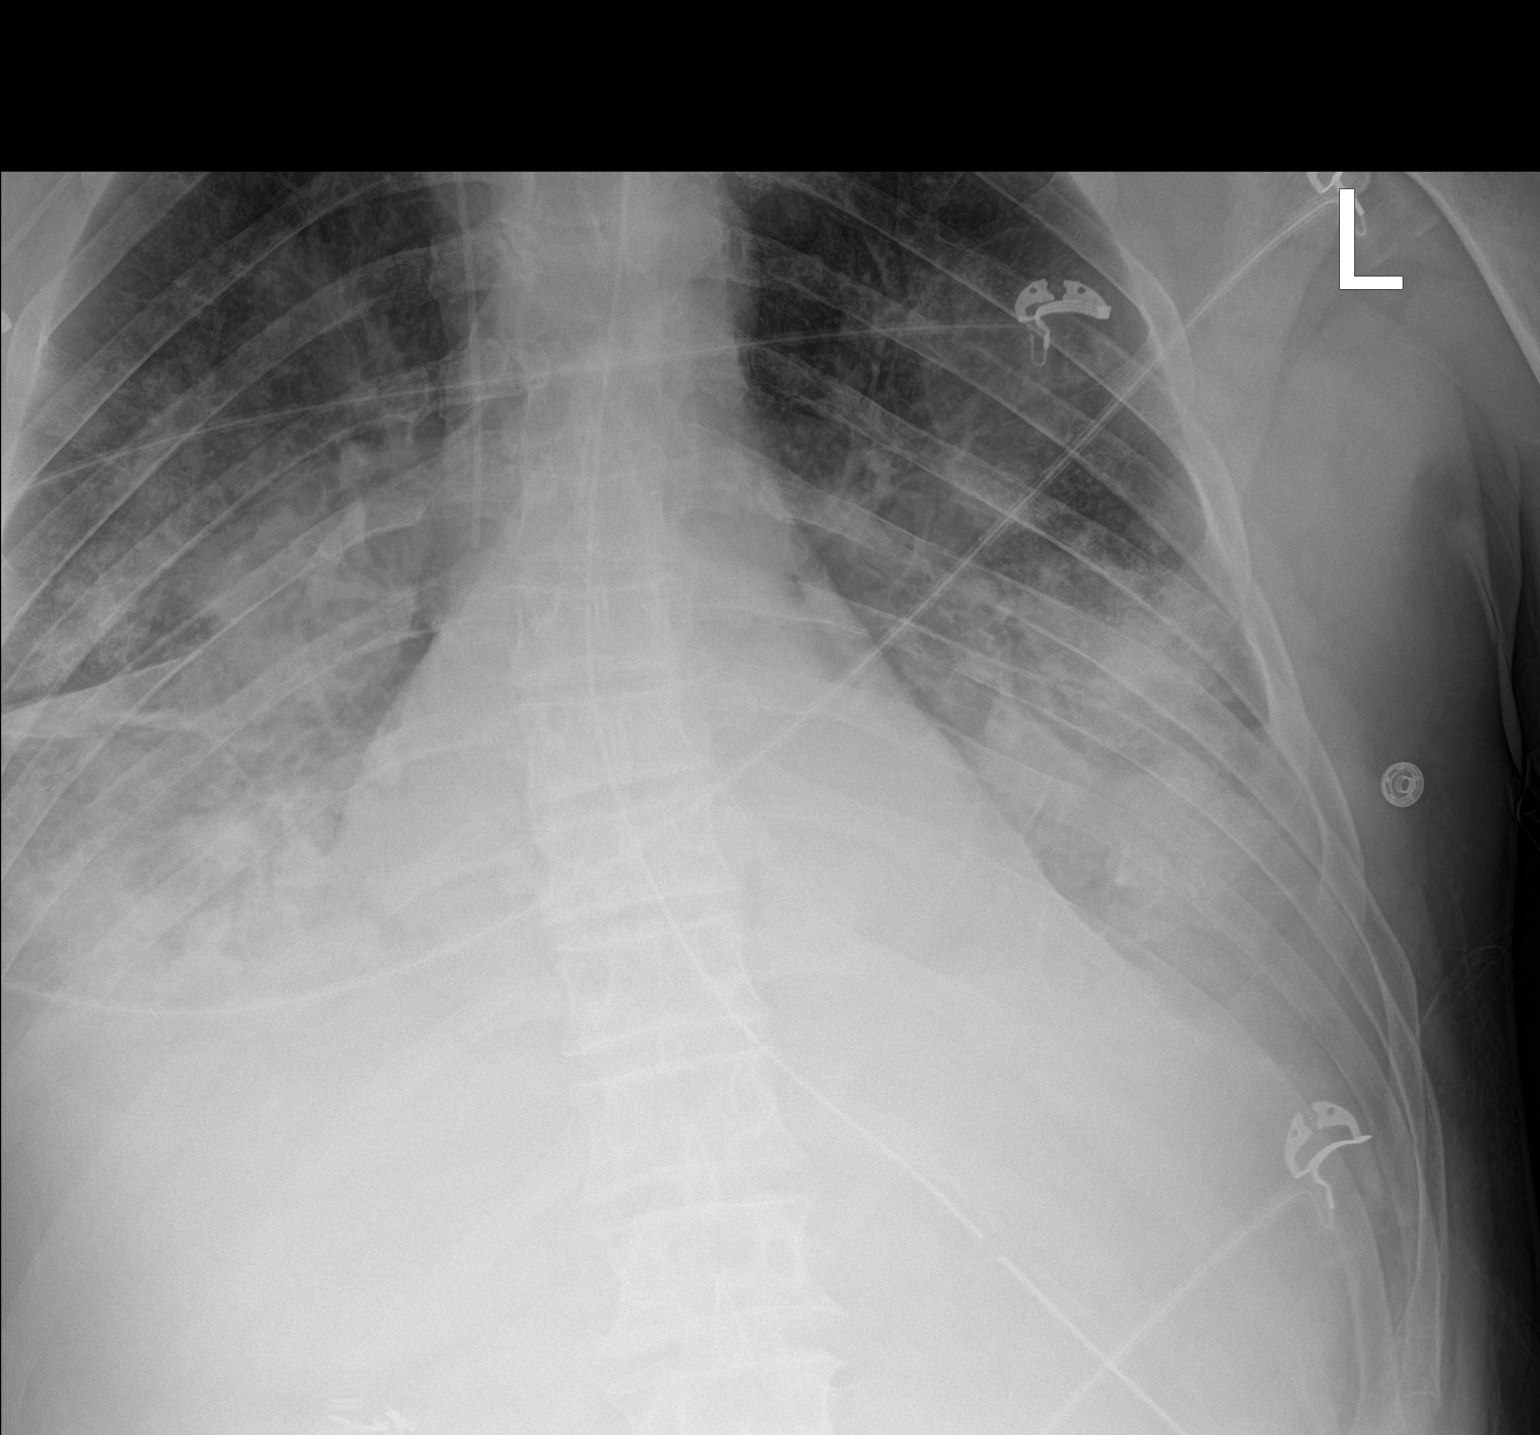
[im 2/2]
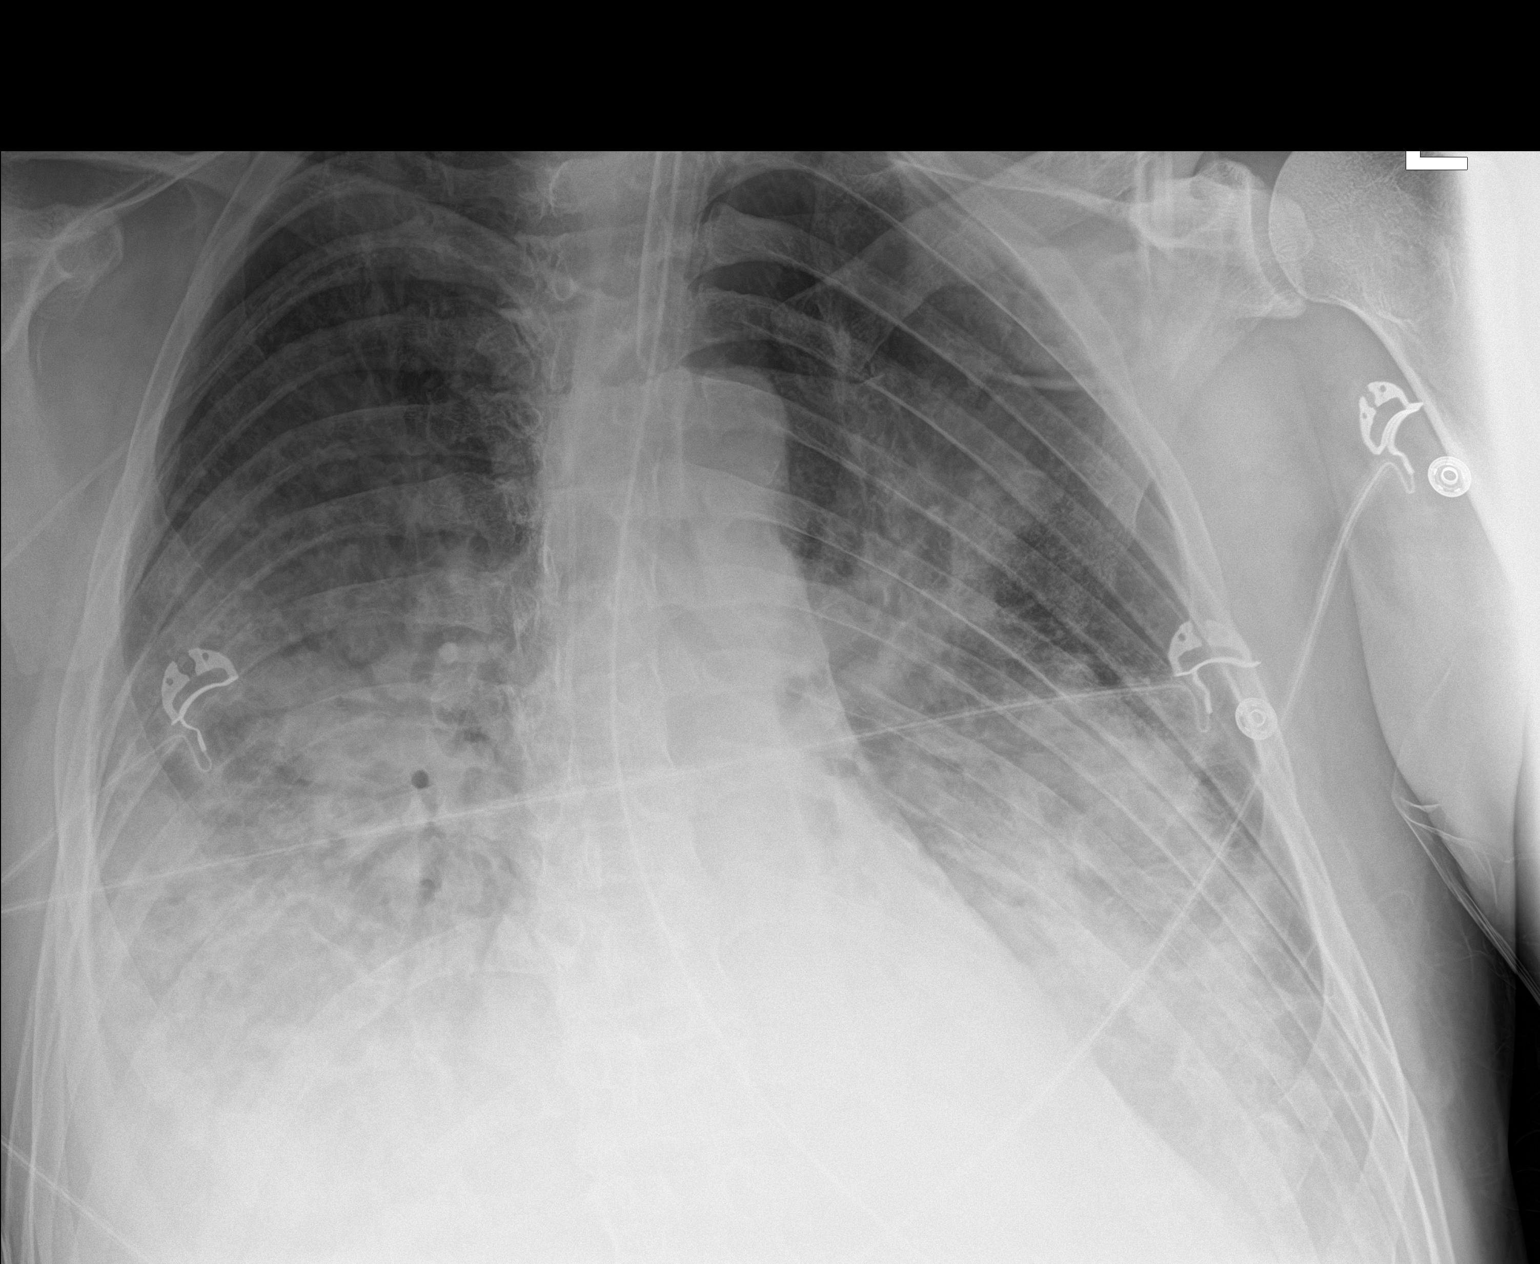

[2 of 2 positions shown; findings below may reference images not displayed]

FINDINGS: Right upper extremity PICC tip terminates in the lower SVC.
Endotracheal tube terminates appropriately in the mid trachea, 5 cm
from the carina. Transesophageal tube tip and side port distal to
the GE junction.

Extensive airspace disease in the infrahilar lungs a background of
more hazy interstitial opacity indistinct pulmonary vascularity.
Cardiac silhouette appears borderline enlarged. Suspect bilateral
effusions. No pneumothorax. No acute osseous or soft tissue
abnormality.
IMPRESSION: 1. Appropriate positioning of lines and tubes.
2. Extensive airspace disease in the infrahilar lungs, likely
reflecting a combination of alveolar edema and atelectasis.
Superimposed infection cannot be excluded.
3. Suspect small bilateral pleural effusions.
